# Patient Record
Sex: Female | Born: 1995 | Race: Black or African American | Hispanic: No | Marital: Single | Smoking: Current some day smoker
Health system: Southern US, Community
[De-identification: ages and names within clinical notes are randomized; demographics above are authoritative.]

## PROBLEM LIST (undated history)

## (undated) DIAGNOSIS — J45909 Unspecified asthma, uncomplicated: Secondary | ICD-10-CM

## (undated) DIAGNOSIS — N939 Abnormal uterine and vaginal bleeding, unspecified: Principal | ICD-10-CM

## (undated) DIAGNOSIS — A749 Chlamydial infection, unspecified: Secondary | ICD-10-CM

## (undated) DIAGNOSIS — N840 Polyp of corpus uteri: Secondary | ICD-10-CM

## (undated) DIAGNOSIS — G43909 Migraine, unspecified, not intractable, without status migrainosus: Secondary | ICD-10-CM

## (undated) HISTORY — DX: Abnormal uterine and vaginal bleeding, unspecified: N93.9

## (undated) HISTORY — PX: TONSILLECTOMY: SUR1361

---
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
## 2014-07-18 ENCOUNTER — Encounter (HOSPITAL_COMMUNITY): Payer: Self-pay | Admitting: *Deleted

## 2014-07-18 ENCOUNTER — Emergency Department (HOSPITAL_COMMUNITY)
Admission: EM | Admit: 2014-07-18 | Discharge: 2014-07-18 | Disposition: A | Discharge status: Home / Self Care | Payer: PRIVATE HEALTH INSURANCE | Attending: Emergency Medicine | Admitting: Emergency Medicine

## 2014-07-18 DIAGNOSIS — L239 Allergic contact dermatitis, unspecified cause: Secondary | ICD-10-CM | POA: Diagnosis not present

## 2014-07-18 DIAGNOSIS — Z79899 Other long term (current) drug therapy: Secondary | ICD-10-CM | POA: Insufficient documentation

## 2014-07-18 DIAGNOSIS — Z791 Long term (current) use of non-steroidal anti-inflammatories (NSAID): Secondary | ICD-10-CM | POA: Diagnosis not present

## 2014-07-18 DIAGNOSIS — R21 Rash and other nonspecific skin eruption: Secondary | ICD-10-CM | POA: Diagnosis present

## 2014-07-18 MED ORDER — DIPHENHYDRAMINE HCL 25 MG PO TABS: 25.0000 mg | ORAL_TABLET | Freq: Four times a day (QID) | ORAL | Status: DC

## 2014-07-18 NOTE — ED Provider Notes (Signed)
CSN: [id number]     Arrival date & time 07/18/14  0820 History   First MD Initiated Contact with Patient 07/18/14 [phone]     Chief Complaint  Patient presents with  . Rash     (Consider location/radiation/quality/duration/timing/severity/associated sxs/prior Treatment) HPI The patient presents stating she has scabies and she believes she has that because there are bedbugs at her residence. She does not have any known contacts that has been diagnosed with scabies. She states the bedbugs have been visualized and they are trying to treat her home. She reports that she's had itching and bumps on the back of her arms and some on her lower back. She reports this is, over the past few days. There is none present on her face neck hands and feet. There are no associated symptoms. History reviewed. No pertinent past medical history. History reviewed. No pertinent past surgical history. No family history on file. History  Substance Use Topics  . Smoking status: Never Smoker   . Smokeless tobacco: Not on file  . Alcohol Use: No   OB History    No data available     Review of Systems  Constitutional: No fevers no chills no general illness.  Allergies  Review of patient's allergies indicates no known allergies.  Home Medications   Prior to Admission medications   Medication Sig Start Date End Date Taking? Authorizing Provider  diphenhydrAMINE (BENADRYL) 25 MG tablet Take 1 tablet (25 mg total) by mouth every 6 (six) hours. 07/18/14   Arby Barrette, MD  methocarbamol (ROBAXIN) 500 MG tablet Take 1 tablet (500 mg total) by mouth 2 (two) times daily. 12/20/13   Heather Laisure, PA-C  naproxen (NAPROSYN) 500 MG tablet Take 1 tablet (500 mg total) by mouth 2 (two) times daily. 12/20/13   Heather Laisure, PA-C  traMADol (ULTRAM) 50 MG tablet Take 1 tablet (50 mg total) by mouth every 6 (six) hours as needed. 12/20/13   Heather Laisure, PA-C   BP 142/90 mmHg  Pulse 92  Temp(Src) 97.5 F (36.4 C)  (Oral)  Resp 17  SpO2 100% Physical Exam  Constitutional: She is oriented to person, place, and time. She appears well-developed and well-nourished. No distress.  HENT:  Head: Normocephalic and atraumatic.  Right Ear: External ear normal.  Left Ear: External ear normal.  Nose: Nose normal.  Mouth/Throat: Oropharynx is clear and moist.  The head scalp and neck are free of any lesions. Skin is all normal.  Eyes: EOM are normal. Pupils are equal, round, and reactive to light. Right eye exhibits no discharge. Left eye exhibits no discharge.  Neck: Neck supple.  Pulmonary/Chest: Effort normal.  Musculoskeletal: Normal range of motion.  Neurological: She is alert and oriented to person, place, and time. She exhibits normal muscle tone. Coordination normal.  Skin: Skin is warm and dry.  The patient has a fine papular rash concentrated on the dorsal and medial surfaces of her upper arm and over the elbow. This is on both arms. There are no linear burrows or plaques. Of note all of the web spaces are in normal condition. No rashes present on her hands or the flexor surfaces of her extremities. Lower legs are free of rash. They're about 3-5 very subtle papules with a dry scab on them less than 3 mm in size on her lower back.  Psychiatric: She has a normal mood and affect.    ED Course  Procedures (including critical care time) Labs Review Labs Reviewed - No data  to display  Imaging Review No results found.   EKG Interpretation None      MDM   Final diagnoses:  Allergic dermatitis   Patient voices concern for scabies. At this point there is no evidence of scabies, the rash is not typical and there is no known contact. Patient reports having known bedbugs at her home. Rash is possibly secondary to bedbugs but is atypical given the fine, papular nature that is concentrated on the dorsal aspects of the arms. Rash is most suggestive of a contact dermatitis or mild folliculitis. The patient  will be treated with Benadryl. At this point rash is not extensive enough to warrant steroid therapy. Discharge instructions provided education on bedbugs and scabies. The nature of these 2 diagnoses was explained to the patient.    Arby Barrette, MD 07/18/14 [phone]

## 2014-07-18 NOTE — Discharge Instructions (Signed)
Information about Bedbugs Bedbugs are tiny bugs that live in and around beds. During the day, they hide in mattresses and other places near beds. They come out at night and bite people lying in bed. They need blood to live and grow. Bedbugs can be found in beds anywhere. Usually, they are found in places where many people come and go (hotels, shelters, hospitals). It does not matter whether the place is dirty or clean. Getting bitten by bedbugs rarely causes a medical problem. The biggest problem can be getting rid of them. This often takes the work of a Oncologist. CAUSES  Less use of pesticides. Bedbugs were common before the 1950s. Then, strong pesticides such as DDT nearly wiped them out. Today, these pesticides are not used because they harm the environment and can cause health problems.  More travel. Besides mattresses, bedbugs can also live in clothing and luggage. They can come along as people travel from place to place. Bedbugs are more common in certain parts of the world. When people travel to those areas, the bugs can come home with them.  Presence of birds and bats. Bedbugs often infest birds and bats. If you have these animals in or near your home, bedbugs may infest your house, too. SYMPTOMS It does not hurt to be bitten by a bedbug. You will probably not wake up when you are bitten. Bedbugs usually bite areas of the skin that are not covered. Symptoms may show when you wake up, or they may take a day or more to show up. Symptoms may include:  Small red bumps on the skin. These might be lined up in a row or clustered in a group.  A darker red dot in the middle of red bumps.  Blisters on the skin. There may be swelling and very bad itching. These may be signs of an allergic reaction. This does not happen often. DIAGNOSIS Bedbug bites might look and feel like other types of insect bites. The bugs do not stay on the body like ticks or lice. They bite, drop off, and crawl away  to hide. Your caregiver will probably:  Ask about your symptoms.  Ask about your recent activities and travel.  Check your skin for bedbug bites.  Ask you to check at home for signs of bedbugs. You should look for:  Spots or stains on the bed or nearby. This could be from bedbugs that were crushed or from their eggs or waste.  Bedbugs themselves. They are reddish-brown, oval, and flat. They do not fly. They are about the size of an apple seed.  Places to look for bedbugs include:  Beds. Check mattresses, headboards, box springs, and bed frames.  On drapes and curtains near the bed.  Under carpeting in the bedroom.  Behind electrical outlets.  Behind any wallpaper that is peeling.  Inside luggage. TREATMENT Most bedbug bites do not need treatment. They usually go away on their own in a few days. The bites are not dangerous. However, treatment may be needed if you have scratched so much that your skin has become infected. You may also need treatment if you are allergic to bedbug bites. Treatment options include:  A drug that stops swelling and itching (corticosteroid). Usually, a cream is rubbed on the skin. If you have a bad rash, you may be given a corticosteroid pill.  Oral antihistamines. These are pills to help control itching.  Antibiotic medicines. An antibiotic may be prescribed for infected skin. HOME CARE  INSTRUCTIONS   Take any medicine prescribed by your caregiver for your bites. Follow the directions carefully.  Consider wearing pajamas with long sleeves and pant legs.  Your bedroom may need to be treated. A pest control expert should make sure the bedbugs are gone. You may need to throw away mattresses or luggage. Ask the pest control expert what you can do to keep the bedbugs from coming back. Common suggestions include:  Putting a plastic cover over your mattress.  Washing and drying your clothes and bedding in hot water and a hot dryer. The temperature  should be hotter than 120 F (48.9 C). Bedbugs are killed by high temperatures.  Vacuuming carefully all around your bed. Vacuum in all cracks and crevices where the bugs might hide. Do this often.  Carefully checking all used furniture, bedding, or clothes that you bring into your house.  Eliminating bird nests and bat roosts.  If you get bedbug bites when traveling, check all your possessions carefully before bringing them into your house. If you find any bugs on clothes or in your luggage, consider throwing those items away. SEEK MEDICAL CARE IF:  You have red bug bites that keep coming back.  You have red bug bites that itch badly.  You have bug bites that cause a skin rash.  You have scratch marks that are red and sore. SEEK IMMEDIATE MEDICAL CARE IF: You have a fever. Document Released: 02/19/2010 Document Revised: 04/11/2011 Document Reviewed: 02/19/2010 [facility] Patient Information 2015 [place], [place]. This information is not intended to replace advice given to you by your health care provider. Make sure you discuss any questions you have with your health care provider. Information about Scabies Scabies are small bugs (mites) that burrow under the skin and cause red bumps and severe itching. These bugs can only be seen with a microscope. Scabies are highly contagious. They can spread easily from person to person by direct contact. They are also spread through sharing clothing or linens that have the scabies mites living in them. It is not unusual for an entire family to become infected through shared towels, clothing, or bedding.  HOME CARE INSTRUCTIONS   Your caregiver may prescribe a cream or lotion to kill the mites. If cream is prescribed, massage the cream into the entire body from the neck to the bottom of both feet. Also massage the cream into the scalp and face if your child is less than 20 year old. Avoid the eyes and mouth. Do not wash your hands after  application.  Leave the cream on for 8 to 12 hours. Your child should bathe or shower after the 8 to 12 hour application period. Sometimes it is helpful to apply the cream to your child right before bedtime.  One treatment is usually effective and will eliminate approximately 95% of infestations. For severe cases, your caregiver may decide to repeat the treatment in 1 week. Everyone in your household should be treated with one application of the cream.  New rashes or burrows should not appear within 24 to 48 hours after successful treatment. However, the itching and rash may last for 2 to 4 weeks after successful treatment. Your caregiver may prescribe a medicine to help with the itching or to help the rash go away more quickly.  Scabies can live on clothing or linens for up to 3 days. All of your child's recently used clothing, towels, stuffed toys, and bed linens should be washed in hot water and then dried in  a dryer for at least 20 minutes on high heat. Items that cannot be washed should be enclosed in a plastic bag for at least 3 days.  To help relieve itching, bathe your child in a cool bath or apply cool washcloths to the affected areas.  Your child may return to school after treatment with the prescribed cream. SEEK MEDICAL CARE IF:   The itching persists longer than 4 weeks after treatment.  The rash spreads or becomes infected. Signs of infection include red blisters or yellow-tan crust. Document Released: 01/17/2005 Document Revised: 04/11/2011 Document Reviewed: 05/28/2008 [facility] Patient Information 2015 [place], [place]. This information is not intended to replace advice given to you by your health care provider. Make sure you discuss any questions you have with your health care provider.

## 2014-07-18 NOTE — ED Notes (Signed)
Pt reports infestation of bedbugs at her place of residence, sts has scabies on her arms and legs. Pt sts her skin is "getting raw it's that bad". Rash noted on bilateral arms.

## 2014-09-11 ENCOUNTER — Emergency Department (INDEPENDENT_AMBULATORY_CARE_PROVIDER_SITE_OTHER)
Admission: EM | Admit: 2014-09-11 | Discharge: 2014-09-11 | Disposition: A | Discharge status: Home / Self Care | Payer: PRIVATE HEALTH INSURANCE | Source: Home / Self Care

## 2014-09-11 ENCOUNTER — Encounter (HOSPITAL_COMMUNITY): Payer: Self-pay

## 2014-09-11 DIAGNOSIS — M2142 Flat foot [pes planus] (acquired), left foot: Secondary | ICD-10-CM

## 2014-09-11 DIAGNOSIS — M2141 Flat foot [pes planus] (acquired), right foot: Secondary | ICD-10-CM

## 2014-09-11 DIAGNOSIS — M259 Joint disorder, unspecified: Secondary | ICD-10-CM | POA: Diagnosis not present

## 2014-09-11 DIAGNOSIS — R29898 Other symptoms and signs involving the musculoskeletal system: Secondary | ICD-10-CM

## 2014-09-11 MED ORDER — DICLOFENAC SODIUM 75 MG PO TBEC: 75.0000 mg | DELAYED_RELEASE_TABLET | Freq: Two times a day (BID) | ORAL | Status: DC

## 2014-09-11 NOTE — Discharge Instructions (Signed)
Because of your foot pain is likely from flat feet and weak ankles. Please remember to wear shoes that provide good arch support, performed ankle exercises as discussed, use the Voltaren for more prolonged pain and inflammation relief, or use ibuprofen 600 mg every 6 hours, and a compression sleeve to help with support. This pain may come and go from time to time over the course of her life but would likely benefit from the measures mentioned above. May eventually need to see a podiatrist for formal physical therapy.

## 2014-09-11 NOTE — ED Notes (Signed)
Patient here for foot and ankle swelling to her left foot  Foot has been this way for about a month Denies any injury

## 2014-09-11 NOTE — ED Provider Notes (Signed)
CSN: [id number]     Arrival date & time 09/11/14  1938 History   None    Chief Complaint  Patient presents with  . Foot Pain   (Consider location/radiation/quality/duration/timing/severity/associated sxs/prior Treatment) HPI  L ankle and foot pain: ongoing for 3-4 weeks. Started along the outside and top of foot. Progressed to the entire foot adn ankle. No injury to the ankle and foot. Sharp pain. Intermittent. Pain is daily but not always when walking. Nothing makes it worse. Pain episodes last around to 1 hour. Not currently in pain.    History reviewed. No pertinent past medical history. History reviewed. No pertinent past surgical history. Family History  Problem Relation Age of Onset  . Diabetes Other   . Asthma Other   . Cancer Other    Social History  Substance Use Topics  . Smoking status: Never Smoker   . Smokeless tobacco: None  . Alcohol Use: No   OB History    No data available     Review of Systems Per HPI with all other pertinent systems negative.   Allergies  Review of patient's allergies indicates no known allergies.  Home Medications   Prior to Admission medications   Medication Sig Start Date End Date Taking? Authorizing Provider  diclofenac (VOLTAREN) 75 MG EC tablet Take 1 tablet (75 mg total) by mouth 2 (two) times daily. 09/11/14   Ozella Rocks, MD   BP 102/88 mmHg  Pulse 91  Temp(Src) 98.2 F (36.8 C) (Oral)  Resp 16  SpO2 100% Physical Exam Physical Exam  Constitutional: oriented to person, place, and time. appears well-developed and well-nourished. No distress.  HENT:  Head: Normocephalic and atraumatic.  Eyes: EOMI. PERRL.  Neck: Normal range of motion.  Cardiovascular: RRR, no m/r/g, 2+ distal pulses,  Pulmonary/Chest: Effort normal and breath sounds normal. No respiratory distress.  Abdominal: Soft. Bowel sounds are normal. NonTTP, no distension.  Musculoskeletal: Pes planus bilaterally with left greater than right, no  ankle instability. Full range of motion. No edema or effusion noted. No bony abnormality. Diffuse intermittent tenderness of the ankle without focal tenderness. Patient unable to do single leg stand.  Neurological: alert and oriented to person, place, and time.  Skin: Skin is warm. No rash noted. non diaphoretic.  Psychiatric: normal mood and affect. behavior is normal. Judgment and thought content normal.   ED Course  Procedures (including critical care time) Labs Review Labs Reviewed - No data to display  Imaging Review No results found.   MDM   1. Pes planus of both feet   2. Ankle weakness    The majority of patient's pain in her ankle and foot is likely from poor biomechanics including pes planus and weak ankles. Detailed exercise instructions given to patient regarding ankle strengthening, use of neoprene compression sleeve, she will inserts for arch support, and anti-inflammatories. Patient follow-up with podiatry, sports medicine physical therapy if needed for additional care. Patient may benefit greatly from custom orthotics.    Ozella Rocks, MD 09/11/14 2036

## 2014-10-18 ENCOUNTER — Emergency Department (HOSPITAL_COMMUNITY)
Admission: EM | Admit: 2014-10-18 | Discharge: 2014-10-18 | Disposition: A | Discharge status: Home / Self Care | Payer: No Typology Code available for payment source | Attending: Emergency Medicine | Admitting: Emergency Medicine

## 2014-10-18 ENCOUNTER — Encounter (HOSPITAL_COMMUNITY): Payer: Self-pay | Admitting: Emergency Medicine

## 2014-10-18 DIAGNOSIS — Z8679 Personal history of other diseases of the circulatory system: Secondary | ICD-10-CM | POA: Diagnosis not present

## 2014-10-18 DIAGNOSIS — Z793 Long term (current) use of hormonal contraceptives: Secondary | ICD-10-CM | POA: Insufficient documentation

## 2014-10-18 DIAGNOSIS — R51 Headache: Secondary | ICD-10-CM | POA: Diagnosis present

## 2014-10-18 DIAGNOSIS — R519 Headache, unspecified: Secondary | ICD-10-CM

## 2014-10-18 HISTORY — DX: Migraine, unspecified, not intractable, without status migrainosus: G43.909

## 2014-10-18 MED ORDER — METOCLOPRAMIDE HCL 10 MG PO TABS
5.0000 mg | ORAL_TABLET | Freq: Once | ORAL | Status: AC
  Administered 2014-10-18: 5 mg via ORAL
  Filled 2014-10-18: qty 1

## 2014-10-18 MED ORDER — DIPHENHYDRAMINE HCL 25 MG PO CAPS
25.0000 mg | ORAL_CAPSULE | Freq: Once | ORAL | Status: AC
  Administered 2014-10-18: 25 mg via ORAL
  Filled 2014-10-18: qty 1

## 2014-10-18 MED ORDER — KETOROLAC TROMETHAMINE 30 MG/ML IJ SOLN
30.0000 mg | Freq: Once | INTRAMUSCULAR | Status: AC
  Administered 2014-10-18: 30 mg via INTRAVENOUS
  Filled 2014-10-18: qty 1

## 2014-10-18 NOTE — ED Provider Notes (Signed)
CSN: [id number]     Arrival date & time 10/18/14  1108 History   First MD Initiated Contact with Patient 10/18/14 1129     Chief Complaint  Patient presents with  . Headache   HPI    19 year old female presents with a headache. Patient reports significant past medical history of headaches, reports this is similar to previous presentations. Patient reports this one started slowly yesterday, reports that it is "everywhere", not relieved with that home over-the-counter therapies including ibuprofen or Tylenol. She reports photophobia, phonophobia yesterday that has since resolved. Patient denies trauma to the head, fever, changes in vision smelt taste, any focal neurological deficits, neck stiffness, insect bites, rash, or any other red flags for headache. Patient reports that she seen her primary care provider for her headaches, has been prescribed tramadol with minimal relief of symptoms. Patient reports she's never seen a specialist for her headaches. Patient denies drug or alcohol use, reports that she drinks plenty of water, but notes that she's been outside significantly with prolonged exposure to heat.  Past Medical History  Diagnosis Date  . Migraine    Past Surgical History  Procedure Laterality Date  . Tonsillectomy     Family History  Problem Relation Age of Onset  . Diabetes Other   . Asthma Other   . Cancer Other    Social History  Substance Use Topics  . Smoking status: Never Smoker   . Smokeless tobacco: None  . Alcohol Use: No   OB History    No data available     Review of Systems  All other systems reviewed and are negative.   Allergies  Review of patient's allergies indicates no known allergies.  Home Medications   Prior to Admission medications   Medication Sig Start Date End Date Taking? Authorizing Provider  norethindrone-ethinyl estradiol (MICROGESTIN FE 1/20) 1-20 MG-MCG tablet Take 1 tablet by mouth daily.   Yes Historical Provider, MD  diclofenac  (VOLTAREN) 75 MG EC tablet Take 1 tablet (75 mg total) by mouth 2 (two) times daily. Patient not taking: Reported on 10/18/2014 09/11/14   Ozella Rocks, MD   BP 136/82 mmHg  Pulse 72  Temp(Src) 98 F (36.7 C) (Oral)  Resp 16  Ht  (1.753 m)  Wt 257 lb (116.574 kg)  BMI 37.93 kg/m2  SpO2 100%  LMP 10/02/2014   Physical Exam  Constitutional: She is oriented to person, place, and time. She appears well-developed and well-nourished.  HENT:  Head: Normocephalic and atraumatic.  Eyes: Conjunctivae and EOM are normal. Pupils are equal, round, and reactive to light. Right eye exhibits no discharge. Left eye exhibits no discharge. No scleral icterus. Right eye exhibits normal extraocular motion and no nystagmus. Left eye exhibits normal extraocular motion and no nystagmus.  Neck: Normal range of motion. No JVD present. No tracheal deviation present.  Cardiovascular: Normal rate, regular rhythm, normal heart sounds and intact distal pulses.  Exam reveals no gallop and no friction rub.   No murmur heard. Pulmonary/Chest: Effort normal. No stridor.  Neurological: She is alert and oriented to person, place, and time. She has normal strength. No cranial nerve deficit or sensory deficit. Coordination normal. GCS eye subscore is 4. GCS verbal subscore is 5. GCS motor subscore is 6.  Psychiatric: She has a normal mood and affect. Her behavior is normal. Judgment and thought content normal.  Nursing note and vitals reviewed.   ED Course  Procedures (including critical care time) Labs Review Labs  Reviewed - No data to display  Imaging Review No results found. I have personally reviewed and evaluated these images and lab results as part of my medical decision-making.   EKG Interpretation None      MDM   Final diagnoses:  Acute nonintractable headache, unspecified headache type    Labs:  Imaging:  Consults:  Therapeutics: Benadryl, Reglan, Toradol  Discharge Meds:    Assessment/Plan: Patient presents with headache, no red flags, similar to previous. Patient treated here in the ED with above therapeutics with complete resolution of symptoms. Patient instructed to monitor for new or worsening signs or symptoms return immediately if any present. Patient encouraged follow-up with her primary care provider for reevaluation. Patient given follow-up information for neurology.         Eyvonne Mechanic, PA-C 10/18/14 2012  Glynn Octave, MD 10/19/14 [phone]

## 2014-10-18 NOTE — ED Notes (Signed)
Pt ambulated to BR with no difficulty.  

## 2014-10-18 NOTE — ED Notes (Signed)
Pt reports history of migraines. Last night while at work, pt reports had a migraine that continues into today. Pt reports nausea. Denies vision changes.

## 2014-11-06 ENCOUNTER — Encounter (HOSPITAL_COMMUNITY): Payer: Self-pay | Admitting: Emergency Medicine

## 2014-11-06 ENCOUNTER — Emergency Department (HOSPITAL_COMMUNITY)
Admission: EM | Admit: 2014-11-06 | Discharge: 2014-11-07 | Disposition: A | Discharge status: Home / Self Care | Payer: PRIVATE HEALTH INSURANCE | Attending: Emergency Medicine | Admitting: Emergency Medicine

## 2014-11-06 DIAGNOSIS — Z79818 Long term (current) use of other agents affecting estrogen receptors and estrogen levels: Secondary | ICD-10-CM | POA: Insufficient documentation

## 2014-11-06 DIAGNOSIS — G43809 Other migraine, not intractable, without status migrainosus: Secondary | ICD-10-CM

## 2014-11-06 DIAGNOSIS — R51 Headache: Secondary | ICD-10-CM | POA: Diagnosis present

## 2014-11-06 MED ORDER — SODIUM CHLORIDE 0.9 % IV SOLN
1000.0000 mL | INTRAVENOUS | Status: DC
  Administered 2014-11-07: 1000 mL via INTRAVENOUS

## 2014-11-06 MED ORDER — PROCHLORPERAZINE EDISYLATE 5 MG/ML IJ SOLN
10.0000 mg | Freq: Once | INTRAMUSCULAR | Status: AC
  Administered 2014-11-07: 10 mg via INTRAVENOUS
  Filled 2014-11-06: qty 2

## 2014-11-06 MED ORDER — DEXAMETHASONE SODIUM PHOSPHATE 10 MG/ML IJ SOLN
10.0000 mg | Freq: Once | INTRAMUSCULAR | Status: AC
  Administered 2014-11-07: 10 mg via INTRAVENOUS
  Filled 2014-11-06: qty 1

## 2014-11-06 MED ORDER — DIPHENHYDRAMINE HCL 50 MG/ML IJ SOLN
25.0000 mg | Freq: Once | INTRAMUSCULAR | Status: AC
  Administered 2014-11-07: 25 mg via INTRAVENOUS
  Filled 2014-11-06: qty 1

## 2014-11-06 NOTE — ED Provider Notes (Signed)
CSN: [id number]     Arrival date & time 11/06/14  2218 History   First MD Initiated Contact with Patient 11/06/14 2311     Chief Complaint  Patient presents with  . Migraine     HPI  patient presents to the emergency room with a recurrent headache. The headache is diffuse and throbbing. Associated with her headache she has nausea without vomiting. lites seem to exacerbate the headache. She has a history of recurrent headaches since age 19. Patient has been diagnosed with migraines. She does not have a primary doctor and has never seen a neurologist. She gets these headaches at least twice a week. No fevers or chills. No recent injuries. No neck pain or stiffness. No numbness or weakness. Past Medical History  Diagnosis Date  . Migraine    Past Surgical History  Procedure Laterality Date  . Tonsillectomy     Family History  Problem Relation Age of Onset  . Diabetes Other   . Asthma Other   . Cancer Other    Social History  Substance Use Topics  . Smoking status: Never Smoker   . Smokeless tobacco: None  . Alcohol Use: No   OB History    No data available     Review of Systems  All other systems reviewed and are negative.     Allergies  Review of patient's allergies indicates no known allergies.  Home Medications   Prior to Admission medications   Medication Sig Start Date End Date Taking? Authorizing Provider  norethindrone-ethinyl estradiol (MICROGESTIN FE 1/20) 1-20 MG-MCG tablet Take 1 tablet by mouth daily.   Yes Historical Provider, MD  diclofenac (VOLTAREN) 75 MG EC tablet Take 1 tablet (75 mg total) by mouth 2 (two) times daily. Patient not taking: Reported on 10/18/2014 09/11/14   Ozella Rocks, MD  isometheptene-acetaminophen-dichloralphenazone (MIDRIN) [phone] MG capsule Take 1 capsule by mouth 4 (four) times daily as needed for migraine. Maximum 5 capsules in 12 hours for migraine headaches, 8 capsules in 24 hours for tension headaches. 11/07/14   Linwood Dibbles, MD   BP 146/82 mmHg  Pulse 85  Resp 18  SpO2 100%  LMP 11/03/2014 (Exact Date) Physical Exam  Constitutional: She appears well-developed and well-nourished. No distress.  HENT:  Head: Normocephalic and atraumatic.  Right Ear: External ear normal.  Left Ear: External ear normal.  Eyes: Conjunctivae are normal. Right eye exhibits no discharge. Left eye exhibits no discharge. No scleral icterus.  Neck: Neck supple. No tracheal deviation present.  Cardiovascular: Normal rate, regular rhythm and intact distal pulses.   Pulmonary/Chest: Effort normal and breath sounds normal. No stridor. No respiratory distress. She has no wheezes. She has no rales.  Abdominal: Soft. Bowel sounds are normal. She exhibits no distension. There is no tenderness. There is no rebound and no guarding.  Musculoskeletal: She exhibits no edema or tenderness.  Neurological: She is alert. She has normal strength. No cranial nerve deficit (no facial droop, extraocular movements intact, no slurred speech) or sensory deficit. She exhibits normal muscle tone. She displays no seizure activity. Coordination normal.  Skin: Skin is warm and dry. No rash noted.  Psychiatric: She has a normal mood and affect.  Nursing note and vitals reviewed.   ED Course  Procedures (including critical care time) Medications  0.9 %  sodium chloride infusion (1,000 mLs Intravenous New Bag/Given 11/07/14 0010)  prochlorperazine (COMPAZINE) injection 10 mg (10 mg Intravenous Given 11/07/14 0010)  diphenhydrAMINE (BENADRYL) injection 25 mg (25  mg Intravenous Given 11/07/14 0010)  dexamethasone (DECADRON) injection 10 mg (10 mg Intravenous Given 11/07/14 0010)     MDM   Final diagnoses:  Other migraine without status migrainosus, not intractable    Sx improved with treatment.  Will refer to neurology considering her recurrent migraine headaches.    At this time there does not appear to be any evidence of an acute emergency medical  condition and the patient appears stable for discharge with appropriate outpatient follow up.   Linwood Dibbles, MD 11/07/14 [phone]

## 2014-11-06 NOTE — ED Notes (Signed)
Pt states she has a migraine headache that started around 4pm today   Pt was seen last week for same and mother states what they gave her did not help  Pt has nausea without vomiting  Pt is sensitive to light

## 2014-11-07 MED ORDER — ISOMETHEPTENE-DICHLORAL-APAP 65-100-325 MG PO CAPS: 1.0000 | ORAL_CAPSULE | Freq: Four times a day (QID) | ORAL | Status: DC | PRN

## 2014-11-07 NOTE — Discharge Instructions (Signed)

## 2014-12-03 ENCOUNTER — Encounter: Payer: Self-pay | Admitting: *Deleted

## 2014-12-03 ENCOUNTER — Telehealth: Payer: Self-pay | Admitting: Neurology

## 2014-12-03 NOTE — Telephone Encounter (Signed)
[name] write a note thanks

## 2014-12-03 NOTE — Telephone Encounter (Signed)
Called pt and said I will write letter and put it up front for her to pick up. She needs to bring a valid photo ID with her. She verbalized understanding and said she has the address to our office.

## 2014-12-03 NOTE — Telephone Encounter (Signed)
Patient called to request a note for her job regarding upcoming New Patient Appointment Monday 12/08/14. Patient states that her office is closed on Monday and will be closed for the remainder of the week. Patient needs note today.

## 2014-12-08 ENCOUNTER — Telehealth: Payer: Self-pay | Admitting: *Deleted

## 2014-12-08 ENCOUNTER — Ambulatory Visit: Payer: Medicaid Other | Admitting: Neurology

## 2014-12-08 NOTE — Telephone Encounter (Signed)
no showed new patient appointment. 

## 2014-12-09 ENCOUNTER — Encounter: Payer: Self-pay | Admitting: Neurology

## 2014-12-31 ENCOUNTER — Encounter (HOSPITAL_COMMUNITY): Payer: Self-pay

## 2014-12-31 ENCOUNTER — Emergency Department (HOSPITAL_COMMUNITY)
Admission: EM | Admit: 2014-12-31 | Discharge: 2014-12-31 | Disposition: A | Discharge status: Home / Self Care | Payer: No Typology Code available for payment source | Attending: Emergency Medicine | Admitting: Emergency Medicine

## 2014-12-31 DIAGNOSIS — R0982 Postnasal drip: Secondary | ICD-10-CM

## 2014-12-31 DIAGNOSIS — J3089 Other allergic rhinitis: Secondary | ICD-10-CM

## 2014-12-31 DIAGNOSIS — L299 Pruritus, unspecified: Secondary | ICD-10-CM | POA: Insufficient documentation

## 2014-12-31 DIAGNOSIS — J029 Acute pharyngitis, unspecified: Secondary | ICD-10-CM

## 2014-12-31 DIAGNOSIS — J302 Other seasonal allergic rhinitis: Secondary | ICD-10-CM | POA: Insufficient documentation

## 2014-12-31 DIAGNOSIS — H6993 Unspecified Eustachian tube disorder, bilateral: Secondary | ICD-10-CM | POA: Insufficient documentation

## 2014-12-31 DIAGNOSIS — H6983 Other specified disorders of Eustachian tube, bilateral: Secondary | ICD-10-CM

## 2014-12-31 DIAGNOSIS — G43909 Migraine, unspecified, not intractable, without status migrainosus: Secondary | ICD-10-CM | POA: Insufficient documentation

## 2014-12-31 DIAGNOSIS — R0981 Nasal congestion: Secondary | ICD-10-CM

## 2014-12-31 DIAGNOSIS — H938X3 Other specified disorders of ear, bilateral: Secondary | ICD-10-CM

## 2014-12-31 DIAGNOSIS — Z79818 Long term (current) use of other agents affecting estrogen receptors and estrogen levels: Secondary | ICD-10-CM | POA: Insufficient documentation

## 2014-12-31 MED ORDER — LORATADINE 10 MG PO TABS: 10.0000 mg | ORAL_TABLET | Freq: Every day | ORAL | Status: DC

## 2014-12-31 MED ORDER — FLUTICASONE PROPIONATE 50 MCG/ACT NA SUSP: 2.0000 | Freq: Every day | NASAL | Status: DC

## 2014-12-31 NOTE — ED Provider Notes (Signed)
CSN: [id number]     Arrival date & time 12/31/14  1145 History  By signing my name below, I, Placido SouLogan Joldersma, attest that this documentation has been prepared under the direction and in the presence of Thelton Graca Camprubi-Soms, PA-C. Electronically Signed: Placido SouLogan Joldersma, ED Scribe. 12/31/2014. 12:23 PM.   Chief Complaint  Patient presents with  . Otalgia   Patient is a 19 y.o. female presenting with ear pain. The history is provided by the patient. No language interpreter was used.  Otalgia Location:  Bilateral Behind ear:  No abnormality Quality:  Pressure Severity:  Mild Onset quality:  Gradual Duration:  2 days Timing:  Constant Progression:  Unchanged Chronicity:  New Context: no water in ear   Relieved by:  Nothing Worsened by:  Nothing tried Ineffective treatments:  None tried Associated symptoms: congestion and sore throat   Associated symptoms: no abdominal pain, no diarrhea, no ear discharge, no fever, no rash, no rhinorrhea and no vomiting   Risk factors: no recent travel    HPI Comments: Lowanda Fosterlizae D Dahmen is a 19 y.o. female who presents to the Emergency Department complaining of constant, mild, bilateral ear pain with onset 2 days ago. Pt states the pain is 6/10, constant, pressure-like ear pain which is non-radiating, with no known aggravating or alleviating factors. Associated symptoms include sore throat, sinus congestion, generalized itching without rash, and chills. Pt confirms a sick contact at work with similar symptoms and denies any recent swimming or travel. She denies a hx of seasonal allergies, but thinks she's allergic to dogs and was recently in contact with a dog. She denies drooling, trismus, rhinorrhea, ear drainage, eye itching/redness, fever, SOB, CP, cough, abd pain, n/v/d, constipation, dysuria, hematuria, numbness, tingling, weakness, rashes, and myalgias.   PCP: None  Past Medical History  Diagnosis Date  . Migraine    Past Surgical History   Procedure Laterality Date  . Tonsillectomy     Family History  Problem Relation Age of Onset  . Diabetes Other   . Asthma Other   . Cancer Other    Social History  Substance Use Topics  . Smoking status: Never Smoker   . Smokeless tobacco: None  . Alcohol Use: No   OB History    No data available     Review of Systems  Constitutional: Positive for chills. Negative for fever.  HENT: Positive for congestion, ear pain, sinus pressure and sore throat. Negative for drooling, ear discharge, rhinorrhea and trouble swallowing.   Eyes: Negative for redness.  Respiratory: Negative for shortness of breath.   Cardiovascular: Negative for chest pain.  Gastrointestinal: Negative for nausea, vomiting, abdominal pain, diarrhea and constipation.  Genitourinary: Negative for dysuria and hematuria.  Musculoskeletal: Negative for myalgias and arthralgias.  Skin: Negative for rash.  Allergic/Immunologic: Negative for environmental allergies and immunocompromised state.  Neurological: Negative for weakness and numbness.  A complete 10 system review of systems was obtained and all systems are negative except as noted in the HPI and PMH.    Allergies  Review of patient's allergies indicates no known allergies.  Home Medications   Prior to Admission medications   Medication Sig Start Date End Date Taking? Authorizing Provider  diclofenac (VOLTAREN) 75 MG EC tablet Take 1 tablet (75 mg total) by mouth 2 (two) times daily. Patient not taking: Reported on 10/18/2014 09/11/14   Ozella Rocksavid J Merrell, MD  isometheptene-acetaminophen-dichloralphenazone (MIDRIN) [phone] MG capsule Take 1 capsule by mouth 4 (four) times daily as needed for migraine. Maximum  5 capsules in 12 hours for migraine headaches, 8 capsules in 24 hours for tension headaches. 11/07/14   Linwood Dibbles, MD  norethindrone-ethinyl estradiol (MICROGESTIN FE 1/20) 1-20 MG-MCG tablet Take 1 tablet by mouth daily.    Historical Provider, MD   BP  126/69 mmHg  Pulse 80  Temp(Src) 98.1 F (36.7 C) (Oral)  Resp 15  Ht  (1.753 m)  Wt 257 lb (116.574 kg)  BMI 37.93 kg/m2  SpO2 100%  LMP 12/22/2014 (Exact Date) Physical Exam  Constitutional: She is oriented to person, place, and time. Vital signs are normal. She appears well-developed and well-nourished.  Non-toxic appearance. No distress.  Afebrile, nontoxic, NAD  HENT:  Head: Normocephalic and atraumatic.  Right Ear: Hearing, tympanic membrane, external ear and ear canal normal. Tympanic membrane is not injected, not erythematous and not bulging. No middle ear effusion.  Left Ear: Hearing, tympanic membrane, external ear and ear canal normal. Tympanic membrane is not injected, not erythematous and not bulging.  No middle ear effusion.  Nose: Mucosal edema present. No rhinorrhea.  Mouth/Throat: Uvula is midline, oropharynx is clear and moist and mucous membranes are normal. No trismus in the jaw. No uvula swelling.  Ears are clear bilaterally, no TM bulging or erythema, no effusion. Nose with mild nasal mucosa edema and cobblestoning without rhinorrhea. Oropharynx clear and moist, without uvular swelling or deviation, no trismus or drooling, no tonsillar swelling or erythema, no exudates.   Eyes: Conjunctivae and EOM are normal. Right eye exhibits no discharge. Left eye exhibits no discharge.  Neck: Normal range of motion. Neck supple.  Cardiovascular: Normal rate.   Pulmonary/Chest: Effort normal. No respiratory distress.  Abdominal: Normal appearance. She exhibits no distension.  Musculoskeletal: Normal range of motion.  Lymphadenopathy:       Head (right side): No submandibular and no tonsillar adenopathy present.       Head (left side): No submandibular and no tonsillar adenopathy present.    She has no cervical adenopathy.  No head or neck LAD  Neurological: She is alert and oriented to person, place, and time. She has normal strength. No sensory deficit.  Skin: Skin is  warm, dry and intact. No rash noted.  Psychiatric: She has a normal mood and affect. Her behavior is normal.  Nursing note and vitals reviewed.  ED Course  Procedures  DIAGNOSTIC STUDIES: Oxygen Saturation is 100% on RA, normal by my interpretation.    COORDINATION OF CARE: 12:21 PM Discussed next steps with pt and she agreed to plan.   Labs Review Labs Reviewed - No data to display  Imaging Review No results found.   EKG Interpretation None      MDM   Final diagnoses:  Eustachian tube dysfunction, bilateral  Nasal congestion  Environmental and seasonal allergies  Itching  Ear pressure, bilateral  Sore throat  Postnasal drip    19 y.o. female here with bilateral ear pain and postnasal drip with some sinus congestion as well as generalized itching without rash 2 days. Nasal mucosa edematous with cobblestoning, suspicious for allergies. Throat clear, ears clear bilaterally. Likely eustachian tube dysfunction related to her nasal congestion, will treat with Flonase and started on Claritin. Discussed that this could also be a viral URI given that she has had positive sick contacts recently. Symptomatic control discussed. Follow-up with [facility] and wellness in 1 week for follow-up and to establish medical care. I explained the diagnosis and have given explicit precautions to return to the ER including  for any other new or worsening symptoms. The patient understands and accepts the medical plan as it's been dictated and I have answered their questions. Discharge instructions concerning home care and prescriptions have been given. The patient is STABLE and is discharged to home in good condition.   I personally performed the services described in this documentation, which was scribed in my presence. The recorded information has been reviewed and is accurate.  BP 126/69 mmHg  Pulse 80  Temp(Src) 98.1 F (36.7 C) (Oral)  Resp 15  Ht  (1.753 m)  Wt 116.574 kg  BMI 37.93  kg/m2  SpO2 100%  LMP 12/22/2014 (Exact Date)  Meds ordered this encounter  Medications  . fluticasone (FLONASE) 50 MCG/ACT nasal spray    Sig: Place 2 sprays into both nostrils daily.    Dispense:  16 g    Refill:  0    Order Specific Question:  Supervising Provider    Answer:  MILLER, BRIAN [[id number]]  . loratadine (CLARITIN) 10 MG tablet    Sig: Take 1 tablet (10 mg total) by mouth daily.    Dispense:  30 tablet    Refill:  0    Order Specific Question:  Supervising Provider    Answer:  Eber Hong [[id number]]      Osiris Charles Camprubi-Soms, PA-C 12/31/14 1234  Tilden Fossa, MD 12/31/14 [phone]

## 2014-12-31 NOTE — Discharge Instructions (Signed)
Continue to stay well-hydrated. Gargle warm salt water and spit it out. Continue to alternate between Tylenol and Ibuprofen for pain or fever. Use netipot and flonase to help with nasal congestion. May consider over-the-counter Benadryl or other antihistamine (claritin) to decrease secretions and for itching. Followup with [facility] in 5-7 days for recheck of ongoing symptoms and to establish medical care. Return to emergency department for emergent changing or worsening of symptoms.    Earache An earache, also called otalgia, can be caused by many things. Pain from an earache can be sharp, dull, or burning. The pain may be temporary or constant. Earaches can be caused by problems with the ear, such as infection in either the middle ear or the ear canal, injury, impacted ear wax, middle ear pressure, or a foreign body in the ear. Ear pain can also result from problems in other areas. This is called referred pain. For example, pain can come from a sore throat, a tooth infection, or problems with the jaw or the joint between the jaw and the skull (temporomandibular joint, or TMJ). The cause of an earache is not always easy to identify. Watchful waiting may be appropriate for some earaches until a clear cause of the pain can be found. HOME CARE INSTRUCTIONS Watch your condition for any changes. The following actions may help to lessen any discomfort that you are feeling:  Take medicines only as directed by your health care provider. This includes ear drops.  Apply ice to your outer ear to help reduce pain.  Put ice in a plastic bag.  Place a towel between your skin and the bag.  Leave the ice on for 20 minutes, 2-3 times per day.  Do not put anything in your ear other than medicine that is prescribed by your health care provider.  Try resting in an upright position instead of lying down. This may help to reduce pressure in the middle ear and relieve pain.  Chew gum if it helps to  relieve your ear pain.  Control any allergies that you have.  Keep all follow-up visits as directed by your health care provider. This is important. SEEK MEDICAL CARE IF:  Your pain does not improve within 2 days.  You have a fever.  You have new or worsening symptoms. SEEK IMMEDIATE MEDICAL CARE IF:  You have a severe headache.  You have a stiff neck.  You have difficulty swallowing.  You have redness or swelling behind your ear.  You have drainage from your ear.  You have hearing loss.  You feel dizzy.   This information is not intended to replace advice given to you by your health care provider. Make sure you discuss any questions you have with your health care provider.   Document Released: 09/04/2003 Document Revised: 02/07/2014 Document Reviewed: 08/18/2013 Elsevier Interactive Patient Education [employer] Allergies An allergy is when your body reacts to a substance in a way that is not normal. An allergic reaction can happen after you:  Eat something.  Breathe in something.  Touch something. WHAT KINDS OF ALLERGIES ARE THERE? You can be allergic to:  Things that are only around during certain seasons, like molds and pollens.  Foods.  Drugs.  Insects.  Animal dander. WHAT ARE SYMPTOMS OF ALLERGIES?  Puffiness (swelling). This may happen on the lips, face, tongue, mouth, or throat.  Sneezing.  Coughing.  Breathing loudly (wheezing).  Stuffy nose.  Tingling in the mouth.  A rash.  Itching.  Itchy, red, puffy areas of skin (hives).  Watery eyes.  Throwing up (vomiting).  Watery poop (diarrhea).  Dizziness.  Feeling faint or fainting.  Trouble breathing or swallowing.  A tight feeling in the chest.  A fast heartbeat. HOW ARE ALLERGIES DIAGNOSED? Allergies can be diagnosed with:  A medical and family history.  Skin tests.  Blood tests.  A food diary. A food diary is a record of all the foods, drinks, and  symptoms you have each day.  The results of an elimination diet. This diet involves making sure not to eat certain foods and then seeing what happens when you start eating them again. HOW ARE ALLERGIES TREATED? There is no cure for allergies, but allergic reactions can be treated with medicine. Severe reactions usually need to be treated at a hospital.  HOW CAN REACTIONS BE PREVENTED? The best way to prevent an allergic reaction is to avoid the thing you are allergic to. Allergy shots and medicines can also help prevent reactions in some cases.   This information is not intended to replace advice given to you by your health care provider. Make sure you discuss any questions you have with your health care provider.   Document Released: 05/14/2012 Document Revised: 02/07/2014 Document Reviewed: 10/29/2013 Elsevier Interactive Patient Education 2016 Elsevier Inc.  Pruritus Pruritus is an itching feeling. There are many different conditions and factors that can make your skin itchy. Dry skin is one of the most common causes of itching. Most cases of itching do not require medical attention. Itchy skin can turn into a rash.  HOME CARE INSTRUCTIONS  Watch your pruritus for any changes. Take these steps to help with your condition:  Skin Care  Moisturize your skin as needed. A moisturizer that contains petroleum jelly is best for keeping moisture in your skin.  Take or apply medicines only as directed by your health care provider. This may include:  Corticosteroid cream.  Anti-itch lotions.  Oral anti-histamines.  Apply cool compresses to the affected areas.  Try taking a bath with:  Epsom salts. Follow the instructions on the packaging. You can get these at your local pharmacy or grocery store.  Baking soda. Pour a small amount into the bath as directed by your health care provider.  Colloidal oatmeal. Follow the instructions on the packaging. You can get this at your local pharmacy  or grocery store.  Try applying baking soda paste to your skin. Stir water into baking soda until it reaches a paste-like consistency.   Do not scratch your skin.  Avoid hot showers or baths, which can make itching worse. A cold shower may help with itching as long as you use a moisturizer after.  Avoid scented soaps, detergents, and perfumes. Use gentle soaps, detergents, perfumes, and other cosmetic products. General Instructions  Avoid wearing tight clothes.  Keep a journal to help track what causes your itch. Write down:  What you eat.  What cosmetic products you use.  What you drink.  What you wear. This includes jewelry.  Use a humidifier. This keeps the air moist, which helps to prevent dry skin. SEEK MEDICAL CARE IF:  The itching does not go away after several days.  You sweat at night.  You have weight loss.  You are unusually thirsty.  You urinate more than normal.  You are more tired than normal.  You have abdominal pain.  Your skin tingles.  You feel weak.  Your skin or the whites of your eyes look  yellow (jaundice).  Your skin feels numb.   This information is not intended to replace advice given to you by your health care provider. Make sure you discuss any questions you have with your health care provider.   Document Released: 09/29/2010 Document Revised: 06/03/2014 Document Reviewed: 01/13/2014 Elsevier Interactive Patient Education [employer] Elsevier Inc.

## 2014-12-31 NOTE — ED Notes (Signed)
Patient c/o of ear pain and drainage that began Monday night denies diziness.  Denies taking medications for pain. Denies fever or chills.  Patient states pain 6/10.  Patient also c/o generalized body itching that began last night, denies any allergies, took benadryl last night.  Breathing even and unlabored patient NAD at this time.

## 2015-01-27 ENCOUNTER — Encounter (HOSPITAL_COMMUNITY): Payer: Self-pay | Admitting: Emergency Medicine

## 2015-01-27 ENCOUNTER — Emergency Department (HOSPITAL_COMMUNITY): Payer: No Typology Code available for payment source

## 2015-01-27 ENCOUNTER — Emergency Department (HOSPITAL_COMMUNITY)
Admission: EM | Admit: 2015-01-27 | Discharge: 2015-01-27 | Disposition: A | Discharge status: Home / Self Care | Payer: No Typology Code available for payment source | Attending: Emergency Medicine | Admitting: Emergency Medicine

## 2015-01-27 DIAGNOSIS — Z79818 Long term (current) use of other agents affecting estrogen receptors and estrogen levels: Secondary | ICD-10-CM | POA: Insufficient documentation

## 2015-01-27 DIAGNOSIS — Z3202 Encounter for pregnancy test, result negative: Secondary | ICD-10-CM | POA: Insufficient documentation

## 2015-01-27 DIAGNOSIS — B349 Viral infection, unspecified: Secondary | ICD-10-CM | POA: Insufficient documentation

## 2015-01-27 DIAGNOSIS — J069 Acute upper respiratory infection, unspecified: Secondary | ICD-10-CM | POA: Insufficient documentation

## 2015-01-27 DIAGNOSIS — G43909 Migraine, unspecified, not intractable, without status migrainosus: Secondary | ICD-10-CM | POA: Insufficient documentation

## 2015-01-27 LAB — COMPREHENSIVE METABOLIC PANEL
ALK PHOS: 49 U/L (ref 38–126)
ALT: 11 U/L — ABNORMAL LOW (ref 14–54)
ANION GAP: 7 (ref 5–15)
AST: 15 U/L (ref 15–41)
Albumin: 3.8 g/dL (ref 3.5–5.0)
BILIRUBIN TOTAL: 0.4 mg/dL (ref 0.3–1.2)
BUN: 12 mg/dL (ref 6–20)
CALCIUM: 9.4 mg/dL (ref 8.9–10.3)
CO2: 29 mmol/L (ref 22–32)
Chloride: 104 mmol/L (ref 101–111)
Creatinine, Ser: 0.86 mg/dL (ref 0.44–1.00)
GFR calc non Af Amer: >60 mL/min (ref >60)
Glucose, Bld: 95 mg/dL (ref 65–99)
POTASSIUM: 4.2 mmol/L (ref 3.5–5.1)
Sodium: 140 mmol/L (ref 135–145)
TOTAL PROTEIN: 7.3 g/dL (ref 6.5–8.1)

## 2015-01-27 LAB — LIPASE, BLOOD: Lipase: 30 U/L (ref 11–51)

## 2015-01-27 LAB — CBC
HEMATOCRIT: 37.1 % (ref 36.0–46.0)
HEMOGLOBIN: 12.1 g/dL (ref 12.0–15.0)
MCH: 29.4 pg (ref 26.0–34.0)
MCHC: 32.6 g/dL (ref 30.0–36.0)
MCV: 90 fL (ref 78.0–100.0)
Platelets: 379 10*3/uL (ref 150–400)
RBC: 4.12 MIL/uL (ref 3.87–5.11)
RDW: 14.1 % (ref 11.5–15.5)
WBC: 9.5 10*3/uL (ref 4.0–10.5)

## 2015-01-27 LAB — I-STAT BETA HCG BLOOD, ED (MC, WL, AP ONLY)

## 2015-01-27 MED ORDER — GUAIFENESIN ER 600 MG PO TB12: 600.0000 mg | ORAL_TABLET | Freq: Two times a day (BID) | ORAL | Status: DC

## 2015-01-27 MED ORDER — ONDANSETRON HCL 4 MG/2ML IJ SOLN
4.0000 mg | Freq: Once | INTRAMUSCULAR | Status: AC
  Administered 2015-01-27: 4 mg via INTRAVENOUS
  Filled 2015-01-27: qty 2

## 2015-01-27 MED ORDER — FLUTICASONE PROPIONATE 50 MCG/ACT NA SUSP: 2.0000 | Freq: Every day | NASAL | Status: DC

## 2015-01-27 MED ORDER — ONDANSETRON 4 MG PO TBDP: 4.0000 mg | ORAL_TABLET | Freq: Three times a day (TID) | ORAL | Status: DC | PRN

## 2015-01-27 MED ORDER — SODIUM CHLORIDE 0.9 % IV BOLUS (SEPSIS)
1000.0000 mL | Freq: Once | INTRAVENOUS | Status: AC
  Administered 2015-01-27: 1000 mL via INTRAVENOUS

## 2015-01-27 MED ORDER — BENZONATATE 100 MG PO CAPS: 100.0000 mg | ORAL_CAPSULE | Freq: Three times a day (TID) | ORAL | Status: DC

## 2015-01-27 NOTE — ED Provider Notes (Signed)
CSN: [id number]     Arrival date & time 01/27/15  1143 History   First MD Initiated Contact with Patient 01/27/15 1459     Chief Complaint  Patient presents with  . Cough  . Nausea  . Diarrhea    HPI   Ms. Person is an 19 y.o. female with no significant PMH who presents to the ED for evaluation of cough, sinus drainage, nausea, and diarrhea. She states that two days ago she began experiencing sinus drainage from her ears/nose into her throat with associated productive cough. She states that last night she began feeling nauseated. Denies emesis. She does endorse one episode of watery, nonbloody diarrhea. Denies abdominal pain. Denies dysuria urinary frequency/urgency, fever, chills. Denies sick contacts.   Past Medical History  Diagnosis Date  . Migraine    Past Surgical History  Procedure Laterality Date  . Tonsillectomy     Family History  Problem Relation Age of Onset  . Diabetes Other   . Asthma Other   . Cancer Other    Social History  Substance Use Topics  . Smoking status: Never Smoker   . Smokeless tobacco: None  . Alcohol Use: No   OB History    No data available     Review of Systems  All other systems reviewed and are negative.     Allergies  Review of patient's allergies indicates no known allergies.  Home Medications   Prior to Admission medications   Medication Sig Start Date End Date Taking? Authorizing Provider  norethindrone-ethinyl estradiol (MICROGESTIN FE 1/20) 1-20 MG-MCG tablet Take 1 tablet by mouth daily.   Yes Historical Provider, MD  diclofenac (VOLTAREN) 75 MG EC tablet Take 1 tablet (75 mg total) by mouth 2 (two) times daily. Patient not taking: Reported on 10/18/2014 09/11/14   Ozella Rocks, MD  fluticasone [facility]) 50 MCG/ACT nasal spray Place 2 sprays into both nostrils daily. Patient not taking: Reported on 01/27/2015 12/31/14   Mercedes Camprubi-Soms, PA-C  isometheptene-acetaminophen-dichloralphenazone (MIDRIN) [phone] MG  capsule Take 1 capsule by mouth 4 (four) times daily as needed for migraine. Maximum 5 capsules in 12 hours for migraine headaches, 8 capsules in 24 hours for tension headaches. Patient not taking: Reported on 01/27/2015 11/07/14   Linwood Dibbles, MD  loratadine (CLARITIN) 10 MG tablet Take 1 tablet (10 mg total) by mouth daily. Patient not taking: Reported on 01/27/2015 12/31/14   Mercedes Camprubi-Soms, PA-C   BP 110/63 mmHg  Pulse 88  Temp(Src) 97.8 F (36.6 C) (Oral)  Resp 16  SpO2 100%  LMP 01/18/2015 Physical Exam  Constitutional: She is oriented to person, place, and time. No distress.  HENT:  Right Ear: Tympanic membrane and external ear normal.  Left Ear: Tympanic membrane and external ear normal.  Nose: Mucosal edema present.  Mouth/Throat: Oropharynx is clear and moist and mucous membranes are normal. No oropharyngeal exudate or posterior oropharyngeal erythema.  Eyes: Conjunctivae and EOM are normal. Pupils are equal, round, and reactive to light.  Neck: Normal range of motion. Neck supple.  Cardiovascular: Normal rate, regular rhythm, normal heart sounds and intact distal pulses.   Pulmonary/Chest: Effort normal and breath sounds normal. No respiratory distress. She has no wheezes. She exhibits no tenderness.  Abdominal: Soft. Bowel sounds are normal. She exhibits no distension. There is no tenderness. There is no rebound and no guarding.  Musculoskeletal: She exhibits no edema.  Neurological: She is alert and oriented to person, place, and time. No cranial nerve deficit.  Skin: Skin is warm and dry. She is not diaphoretic.  Psychiatric: She has a normal mood and affect.  Nursing note and vitals reviewed.  Filed Vitals:   01/27/15 1154 01/27/15 1530  BP: 131/75 110/63  Pulse: 93 88  Temp: 97.8 F (36.6 C)   TempSrc: Oral   Resp: 16 16  SpO2: 99% 100%     ED Course  Procedures (including critical care time) Labs Review Labs Reviewed  COMPREHENSIVE METABOLIC PANEL -  Abnormal; Notable for the following:    ALT 11 (*)    All other components within normal limits  LIPASE, BLOOD  CBC  I-STAT BETA HCG BLOOD, ED ([facility], [facility], [facility] ONLY)    Imaging Review Dg Chest 2 View  01/27/2015  CLINICAL DATA:  Cough and upper back pain for 2 days EXAM: CHEST  2 VIEW COMPARISON:  None. FINDINGS: Lungs are clear. Heart size and pulmonary vascularity are normal. No adenopathy. No pneumothorax. No bone lesions. IMPRESSION: No abnormality noted. Electronically Signed   By: Bretta BangWilliam  Woodruff III M.D.   On: 01/27/2015 15:11   I have personally reviewed and evaluated these images and lab results as part of my medical decision-making.   EKG Interpretation None      MDM   Final diagnoses:  URI (upper respiratory infection)  Viral syndrome    Labs unremarkable. Pt is afebrile. CXR negative. Likely viral URI. Will give 1L NS bolus and zofran in the ED and PO challenge. Anticipate d/c home with supportive meds and PCP f/u.  Pt able to tolerate PO. VSS. STable for discharge. ER return precautions given.  Carlene CoriaSerena Y Anni Hocevar, PA-C 01/28/15 [id number]  Tilden FossaElizabeth Rees, MD 01/29/15 [phone]

## 2015-01-27 NOTE — ED Notes (Signed)
Pt tolerated water

## 2015-01-27 NOTE — Discharge Instructions (Signed)
Your labs and chest x-ray today were unremarkable. As we discussed, your symptoms are likely due to a virus and will just take time to feel better. I will give you several prescriptions to help with your symptoms. Drink plenty of fluids and stick to a simple diet until you feel better.  Take medications as prescribed. Return to the emergency room for worsening condition or new concerning symptoms. Follow up with your regular doctor. If you don't have a regular doctor use one of the numbers below to establish a primary care doctor.   Emergency Department Resource Guide 1) Find a Doctor and Pay Out of Pocket Although you won't have to find out who is covered by your insurance plan, it is a good idea to ask around and get recommendations. You will then need to call the office and see if the doctor you have chosen will accept you as a new patient and what types of options they offer for patients who are self-pay. Some doctors offer discounts or will set up payment plans for their patients who do not have insurance, but you will need to ask so you aren't surprised when you get to your appointment.  2) Contact Your Local Health Department Not all health departments have doctors that can see patients for sick visits, but many do, so it is worth a call to see if yours does. If you don't know where your local health department is, you can check in your phone book. The CDC also has a tool to help you locate your state's health department, and many state websites also have listings of all of their local health departments.  3) Find a Walk-in Clinic If your illness is not likely to be very severe or complicated, you may want to try a walk in clinic. These are popping up all over the country in pharmacies, drugstores, and shopping centers. They're usually staffed by nurse practitioners or physician assistants that have been trained to treat common illnesses and complaints. They're usually fairly quick and  inexpensive. However, if you have serious medical issues or chronic medical problems, these are probably not your best option.  No Primary Care Doctor: - Call Health Connect at  [phone] - they can help you locate a primary care doctor that  accepts your insurance, provides certain services, etc. - Physician Referral [phone]  Emergency Department Resource Guide 1) Find a Doctor and Pay Out of Pocket Although you won't have to find out who is covered by your insurance plan, it is a good idea to ask around and get recommendations. You will then need to call the office and see if the doctor you have chosen will accept you as a new patient and what types of options they offer for patients who are self-pay. Some doctors offer discounts or will set up payment plans for their patients who do not have insurance, but you will need to ask so you aren't surprised when you get to your appointment.  2) Contact Your Local Health Department Not all health departments have doctors that can see patients for sick visits, but many do, so it is worth a call to see if yours does. If you don't know where your local health department is, you can check in your phone book. The CDC also has a tool to help you locate your state's health department, and many state websites also have listings of all of their local health departments.  3) Find a Walk-in Clinic If your illness is  not likely to be very severe or complicated, you may want to try a walk in clinic. These are popping up all over the country in pharmacies, drugstores, and shopping centers. They're usually staffed by nurse practitioners or physician assistants that have been trained to treat common illnesses and complaints. They're usually fairly quick and inexpensive. However, if you have serious medical issues or chronic medical problems, these are probably not your best option.  No Primary Care Doctor: - Call Health Connect at  [phone] - they can  help you locate a primary care doctor that  accepts your insurance, provides certain services, etc. - Physician Referral Service- [phone]  Chronic Pain Problems: Organization         Address  Phone   Notes  [facility]  [phone] Patients need to be referred by their primary care doctor.   Medication Assistance: Organization         Address  Phone   Notes  [facility] [address] [place], [place] [address] [phone] --Must be a resident of [place] -- Must have NO insurance coverage whatsoever (no Medicaid/ Medicare, etc.) -- The pt. MUST have a primary care doctor that directs their care regularly and follows them in the community   Med[facility]ist  [phone]   [employer]  [phone]    Agencies that provide inexpensive medical care: Organization         Address  Phone   Notes  [facility]  [phone]   [facility]    [phone]   [facility] [address] [place], [place] [address] [phone]   [facility] [address], [place] [phone]   [facility]    [phone]   [facility]    [phone]) [phone]   [facility]  [address], [place] Phone:  [phone], Fax:  [phone] Hours of Operation:  9 am - 6 pm, M-F.  Also accepts Medicaid/Medicare and self-pay.  [facility]  [address], [place] Phone: [phone], Fax: [phone]. Hours of Operation:  8:30 am - 5:30 pm, M-F.  Also accepts Medicaid and self-pay.  [facility] [place] [address], [place] Phone: [phone]   [facility] [address], [place], [place] [phone], Ext. [phone] Mondays & Thursdays: 7-9 AM.  First 15 patients are seen on a first come, first serve basis.    Medicaid-accepting [facility]  [place] Providers:  Organization         Address  Phone   Notes  [facility] [address], [place] [phone] Also accepts self-pay patients.  [facility] [address], [place]  [phone]) [phone]   [facility] [address], [place] [phone]   [facility] [address] [place] [address], [place] [phone]   Renaye RakersVeita Bland [address], [place]   [phone] Only accepts [place] patients after they have their name applied to their card.   Self-Pay (no insurance) in [facility]:  Organization         Address  Phone   Notes  Sickle Cell Patients, [facility] [address], [place] [phone]   [facility] [address] [place], [place] [phone]   [facility] [place]  [address], [place] [phone]   [facility]/Dr. Osei-Bonsu  [address], [place] or [address], [place] [phone] Phone number for both [place] and [place] locations is the same.  [facility] [address] [place] [phone]   [facility] [address] [place] or [address] [phone]   [facility] [address], [place] [phone], phone; [phone], fax Sees patients 1st and 3rd Saturday of every month.  Must not qualify for public or private insurance (i.e. Medicaid, Medicare, [place] Health Choice, Veterans' Benefits)  Household income should be no more than 200% of the poverty level The clinic cannot treat you if you are pregnant or think you are pregnant  Sexually transmitted diseases are not treated at the clinic.

## 2015-01-27 NOTE — Progress Notes (Signed)
CM spoke with pt who confirms uninsured [place] resident with no pcp.  CM discussed and provided written information for uninsured accepting pcps, discussed the importance of pcp vs EDP services for f/u care, www.needymeds.org, www.goodrx.com, discounted pharmacies and other [place] resources such as [facility] , [facility], affordable care act, financial assistance, uninsured dental services, [place] med assist, DSS and  health department  Reviewed resources for [place] uninsured accepting pcps like [facility], [facility] [address], [facility] [place], [facility], local urgent care centers, [facility], [facility], general [facility] clinics, [facility], [facility] plus others, medication resources, [facility] out patient pharmacies and housing Pt voiced understanding and appreciation of resources provided   Provided [facility] contact information Pt agreed to a referral Cm completed referral Pt to be contact by [facility] clinical liason  

## 2015-01-27 NOTE — ED Notes (Signed)
Pt reports productive cough and nasal drainage for the past 2 days. Also began to have nausea with 1 episode emesis last night. Also having diarrhea.  Has tolerated fluids today.

## 2015-04-15 ENCOUNTER — Emergency Department (HOSPITAL_COMMUNITY)
Admission: EM | Admit: 2015-04-15 | Discharge: 2015-04-15 | Disposition: A | Discharge status: Home / Self Care | Payer: Medicaid Other | Attending: Emergency Medicine | Admitting: Emergency Medicine

## 2015-04-15 ENCOUNTER — Encounter (HOSPITAL_COMMUNITY): Payer: Self-pay | Admitting: Emergency Medicine

## 2015-04-15 DIAGNOSIS — G43909 Migraine, unspecified, not intractable, without status migrainosus: Secondary | ICD-10-CM | POA: Diagnosis present

## 2015-04-15 NOTE — ED Notes (Signed)
Per patient, states woke up with migraine this am-does not see anyone for them-did not take anything because "nothering works"

## 2015-08-03 ENCOUNTER — Encounter (HOSPITAL_COMMUNITY): Payer: Self-pay | Admitting: *Deleted

## 2015-08-03 ENCOUNTER — Inpatient Hospital Stay (HOSPITAL_COMMUNITY)
Admission: AD | Admit: 2015-08-03 | Discharge: 2015-08-03 | Disposition: A | Discharge status: Home / Self Care | Payer: BLUE CROSS/BLUE SHIELD | Source: Ambulatory Visit | Attending: Obstetrics and Gynecology | Admitting: Obstetrics and Gynecology

## 2015-08-03 DIAGNOSIS — N939 Abnormal uterine and vaginal bleeding, unspecified: Secondary | ICD-10-CM | POA: Insufficient documentation

## 2015-08-03 DIAGNOSIS — R109 Unspecified abdominal pain: Secondary | ICD-10-CM | POA: Diagnosis present

## 2015-08-03 DIAGNOSIS — N921 Excessive and frequent menstruation with irregular cycle: Secondary | ICD-10-CM

## 2015-08-03 HISTORY — DX: Chlamydial infection, unspecified: A74.9

## 2015-08-03 LAB — URINALYSIS, ROUTINE W REFLEX MICROSCOPIC
BILIRUBIN URINE: NEGATIVE
GLUCOSE, UA: NEGATIVE mg/dL
Ketones, ur: NEGATIVE mg/dL
Leukocytes, UA: NEGATIVE
Nitrite: NEGATIVE
PH: 6 (ref 5.0–8.0)
Protein, ur: NEGATIVE mg/dL
SPECIFIC GRAVITY, URINE: 1.02 (ref 1.005–1.030)

## 2015-08-03 LAB — URINE MICROSCOPIC-ADD ON: RBC / HPF: NONE SEEN RBC/hpf (ref 0–5)

## 2015-08-03 LAB — CBC
HCT: 34.4 % — ABNORMAL LOW (ref 36.0–46.0)
HEMOGLOBIN: 11.6 g/dL — AB (ref 12.0–15.0)
MCH: 29.2 pg (ref 26.0–34.0)
MCHC: 33.7 g/dL (ref 30.0–36.0)
MCV: 86.6 fL (ref 78.0–100.0)
Platelets: 299 10*3/uL (ref 150–400)
RBC: 3.97 MIL/uL (ref 3.87–5.11)
RDW: 14.7 % (ref 11.5–15.5)
WBC: 11.7 10*3/uL — ABNORMAL HIGH (ref 4.0–10.5)

## 2015-08-03 LAB — WET PREP, GENITAL
CLUE CELLS WET PREP: NONE SEEN
Sperm: NONE SEEN
TRICH WET PREP: NONE SEEN
YEAST WET PREP: NONE SEEN

## 2015-08-03 LAB — POCT PREGNANCY, URINE: PREG TEST UR: NEGATIVE

## 2015-08-03 NOTE — MAU Note (Addendum)
Pt C/O lower abd cramping x 2 months, has had dark red bloody discharge also for 2 months.  Has had regular periods during this time.  Pt started birth control pills last night - pt of [facility].

## 2015-08-03 NOTE — MAU Provider Note (Signed)
Chief Complaint: Abdominal Cramping and Vaginal Discharge   First Provider Initiated Contact with Patient 08/03/15 1603      SUBJECTIVE HPI: Paula Hawkins is a 20 y.o. G0P0000 at Unknown by LMP who presents to maternity admissions reporting 2 month history of lower abdominal cramping and brown/dark red colored thick, mucous discharge. Reports history of lower abdominal cramps with menstrual cycles but now the cramps are occuring daily, regardless of menstrual cycle. She has taken Midol, Advil, and Tylenol with no relief of her cramps. The only thing that helps is sleep. Patient is sexually active in a mutually monogamous relationship. History of chlamydia 2 years ago. She started birth control pill 1 day ago.   She denies changes in menstrual cycle, vaginal itching/burning, urinary symptoms, dizziness, n/v, or fever/chills.     HPI  Past Medical History  Diagnosis Date  . Migraine   . Chlamydia    Past Surgical History  Procedure Laterality Date  . Tonsillectomy     Social History   Social History  . Marital Status: Single    Spouse Name: N/A  . Number of Children: N/A  . Years of Education: N/A   Occupational History  . Not on file.   Social History Main Topics  . Smoking status: Never Smoker   . Smokeless tobacco: Not on file  . Alcohol Use: No  . Drug Use: No  . Sexual Activity: Yes    Birth Control/ Protection: Pill   Other Topics Concern  . Not on file   Social History Narrative   No current facility-administered medications on file prior to encounter.   Current Outpatient Prescriptions on File Prior to Encounter  Medication Sig Dispense Refill  . norethindrone-ethinyl estradiol (MICROGESTIN FE 1/20) 1-20 MG-MCG tablet Take 1 tablet by mouth at bedtime.      No Known Allergies  ROS:  Review of Systems  Constitutional: Negative for fever, chills and fatigue.  Respiratory: Negative for shortness of breath.   Cardiovascular: Negative for chest pain.   Gastrointestinal: Positive for abdominal pain. Negative for nausea, vomiting and constipation.  Genitourinary: Positive for vaginal bleeding, menstrual problem and pelvic pain. Negative for dysuria, flank pain, vaginal discharge, difficulty urinating and vaginal pain.  Neurological: Negative for dizziness and headaches.  Psychiatric/Behavioral: Negative.      I have reviewed patient's Past Medical Hx, Surgical Hx, Family Hx, Social Hx, medications and allergies.   Physical Exam   Patient Vitals for the past 24 hrs:  BP Temp Temp src Pulse Resp  08/03/15 1810 96/77 mmHg - - 74 18  08/03/15 1522 128/69 mmHg 97.8 F (36.6 C) Oral 97 18   Constitutional: Well-developed, well-nourished female in no acute distress.  Cardiovascular: normal rate Respiratory: normal effort GI: Abd soft, non-tender. Pos BS x 4 MS: Extremities nontender, no edema, normal ROM Neurologic: Alert and oriented x 4.  GU: Neg CVAT.  PELVIC EXAM: Cervix pink, visually closed, without lesion, scant thin white discharge, scant brown discharge noted on cotton swab, vaginal walls and external genitalia normal Bimanual exam: Cervix 0/long/high, firm, anterior, neg CMT, uterus nontender, nonenlarged, adnexa without tenderness, enlargement, or mass   LAB RESULTS Results for orders placed or performed during the hospital encounter of 08/03/15 (from the past 24 hour(s))  Urinalysis, Routine w reflex microscopic (not at [facility])     Status: Abnormal   Collection Time: 08/03/15  3:00 PM  Result Value Ref Range   Color, Urine YELLOW YELLOW   APPearance CLEAR CLEAR  Specific Gravity, Urine 1.020 1.005 - 1.030   pH 6.0 5.0 - 8.0   Glucose, UA NEGATIVE NEGATIVE mg/dL   Hgb urine dipstick SMALL (A) NEGATIVE   Bilirubin Urine NEGATIVE NEGATIVE   Ketones, ur NEGATIVE NEGATIVE mg/dL   Protein, ur NEGATIVE NEGATIVE mg/dL   Nitrite NEGATIVE NEGATIVE   Leukocytes, UA NEGATIVE NEGATIVE  Urine microscopic-add on     Status:  Abnormal   Collection Time: 08/03/15  3:00 PM  Result Value Ref Range   Squamous Epithelial / LPF 6-30 (A) NONE SEEN   WBC, UA 0-5 0 - 5 WBC/hpf   RBC / HPF NONE SEEN 0 - 5 RBC/hpf   Bacteria, UA MANY (A) NONE SEEN  Pregnancy, urine POC     Status: None   Collection Time: 08/03/15  3:52 PM  Result Value Ref Range   Preg Test, Ur NEGATIVE NEGATIVE  Wet prep, genital     Status: Abnormal   Collection Time: 08/03/15  4:10 PM  Result Value Ref Range   Yeast Wet Prep HPF POC NONE SEEN NONE SEEN   Trich, Wet Prep NONE SEEN NONE SEEN   Clue Cells Wet Prep HPF POC NONE SEEN NONE SEEN   WBC, Wet Prep HPF POC MODERATE (A) NONE SEEN   Sperm NONE SEEN   CBC     Status: Abnormal   Collection Time: 08/03/15  4:27 PM  Result Value Ref Range   WBC 11.7 (H) 4.0 - 10.5 K/uL   RBC 3.97 3.87 - 5.11 MIL/uL   Hemoglobin 11.6 (L) 12.0 - 15.0 g/dL   HCT 16.134.4 (L) 09.636.0 - 04.546.0 %   MCV 86.6 78.0 - 100.0 fL   MCH 29.2 26.0 - 34.0 pg   MCHC 33.7 30.0 - 36.0 g/dL   RDW 40.914.7 81.111.5 - 91.415.5 %   Platelets 299 150 - 400 K/uL       IMAGING No results found.  MAU Management/MDM: Ordered labs and reviewed results.  No acute abdomen noted, no evidence of pelvic infection. STD tests pending.  Pt to continue OCPs that were just started, if symptoms persist, f/u in [facility] for irregular bleeding/bleeding between periods.  Pt stable at time of discharge.  ASSESSMENT 1. Abnormal uterine bleeding (AUB)   2. Metrorrhagia     PLAN Discharge home   Medication List    TAKE these medications        MICROGESTIN FE 1/20 1-20 MG-MCG tablet  Generic drug:  norethindrone-ethinyl estradiol  Take 1 tablet by mouth at bedtime.       Follow-up Information    [facility] with [facility]   Specialty:  Obstetrics and Gynecology   Why:  Or with Gyn provider of your choice, Return to MAU as needed for emergencies   Contact information:   [address] [place]  [phone]      Sharen CounterLisa Leftwich-Kirby Certified Nurse-Midwife 08/03/2015  6:22 PM

## 2015-08-03 NOTE — Discharge Instructions (Signed)
Abnormal Uterine Bleeding Abnormal uterine bleeding can affect women at various stages in life, including teenagers, women in their reproductive years, pregnant women, and women who have reached menopause. Several kinds of uterine bleeding are considered abnormal, including:  Bleeding or spotting between periods.   Bleeding after sexual intercourse.   Bleeding that is heavier or more than normal.   Periods that last longer than usual.  Bleeding after menopause.  Many cases of abnormal uterine bleeding are minor and simple to treat, while others are more serious. Any type of abnormal bleeding should be evaluated by your health care provider. Treatment will depend on the cause of the bleeding. HOME CARE INSTRUCTIONS Monitor your condition for any changes. The following actions may help to alleviate any discomfort you are experiencing:  Avoid the use of tampons and douches as directed by your health care provider.  Change your pads frequently. You should get regular pelvic exams and Pap tests. Keep all follow-up appointments for diagnostic tests as directed by your health care provider.  SEEK MEDICAL CARE IF:   Your bleeding lasts more than 1 week.   You feel dizzy at times.  SEEK IMMEDIATE MEDICAL CARE IF:   You pass out.   You are changing pads every 15 to 30 minutes.   You have abdominal pain.  You have a fever.   You become sweaty or weak.   You are passing large blood clots from the vagina.   You start to feel nauseous and vomit. MAKE SURE YOU:   Understand these instructions.  Will watch your condition.  Will get help right away if you are not doing well or get worse.   This information is not intended to replace advice given to you by your health care provider. Make sure you discuss any questions you have with your health care provider.   Document Released: 01/17/2005 Document Revised: 01/22/2013 Document Reviewed: 08/16/2012 Elsevier Interactive  Patient Education 2016 Elsevier Inc. Metrorrhagia Metrorrhagia is bleeding from the uterus that happens irregularly but often. The bleeding generally happens between menstrual periods. HOME CARE INSTRUCTIONS Pay attention to any changes in your symptoms. Follow these instructions to help with your condition: Eating  Eat well-balanced meals. Include foods that are high in iron, such as liver, meat, shellfish, green leafy vegetables, and eggs.  If you become constipated:  Drink plenty of water.  Eat fruits and vegetables that are high in water and fiber, such as spinach, carrots, raspberries, apples, and mango. Medicines  Take over-the-counter and prescription medicines only as told by your health care provider.  Do not change medicines without talking with your health care provider.  Aspirin or medicines that contain aspirin may make the bleeding worse. Do not take those medicines:  During the week before your period.  During your period.  If you were prescribed iron pills, take them as told by your health care provider. Iron pills help to replace iron that your body loses because of this condition. Activity  If you need to change your sanitary pad or tampon more than one time every 2 hours:  Lie in bed with your feet raised (elevated).  Place a cold pack on your lower abdomen.  Rest as much as possible until the bleeding stops or slows down.  Do not try to lose weight until the bleeding has stopped and your blood iron level is back to normal. Other Instructions  For two months, write down:  When your period starts.  When your period ends.  When any abnormal bleeding occurs.  What problems you notice.  Keep all follow-up visits as told by your health care provider. This is important. SEEK MEDICAL CARE IF:  You get light-headed or weak.  You have nausea and vomiting.  You cannot eat or drink without vomiting.  You feel dizzy or have diarrhea while you are  taking medicine.  You are taking birth control pills or hormones, and you want to change them or stop taking them. SEEK IMMEDIATE MEDICAL CARE IF:  You develop a fever or chills.  You need to change your sanitary pad or tampon more than one time per hour.  Your bleeding becomesheavy.  Your flow contains clots.  You develop pain in your abdomen.  You lose consciousness.  You develop a rash.   This information is not intended to replace advice given to you by your health care provider. Make sure you discuss any questions you have with your health care provider.   Document Released: 01/17/2005 Document Revised: 10/08/2014 Document Reviewed: 04/14/2014 Elsevier Interactive Patient Education [employer] Elsevier Inc.

## 2015-08-04 LAB — RPR: RPR Ser Ql: NONREACTIVE

## 2015-08-04 LAB — HIV ANTIBODY (ROUTINE TESTING W REFLEX): HIV Screen 4th Generation wRfx: NONREACTIVE

## 2015-08-04 LAB — HEPATITIS C ANTIBODY (REFLEX): HCV Ab: <0.1 s/co ratio (ref 0.0–0.9)

## 2015-08-04 LAB — HCV COMMENT:

## 2015-08-05 LAB — GC/CHLAMYDIA PROBE AMP (~~LOC~~) NOT AT ARMC
Chlamydia: NEGATIVE
Neisseria Gonorrhea: NEGATIVE

## 2015-08-24 ENCOUNTER — Ambulatory Visit (INDEPENDENT_AMBULATORY_CARE_PROVIDER_SITE_OTHER): Payer: BLUE CROSS/BLUE SHIELD | Admitting: Obstetrics and Gynecology

## 2015-08-24 ENCOUNTER — Encounter: Payer: Self-pay | Admitting: Obstetrics and Gynecology

## 2015-08-24 VITALS — BP 130/82 | HR 83 | Ht 69.0 in | Wt 254.0 lb

## 2015-08-24 DIAGNOSIS — Z01419 Encounter for gynecological examination (general) (routine) without abnormal findings: Secondary | ICD-10-CM

## 2015-08-24 DIAGNOSIS — Z3202 Encounter for pregnancy test, result negative: Secondary | ICD-10-CM | POA: Diagnosis not present

## 2015-08-24 DIAGNOSIS — N939 Abnormal uterine and vaginal bleeding, unspecified: Secondary | ICD-10-CM

## 2015-08-24 LAB — POCT URINE PREGNANCY: PREG TEST UR: NEGATIVE

## 2015-08-24 MED ORDER — NORGESTIMATE-ETH ESTRADIOL 0.25-35 MG-MCG PO TABS: 1.0000 | ORAL_TABLET | Freq: Every day | ORAL | 6 refills | Status: DC

## 2015-08-24 NOTE — Progress Notes (Addendum)
Obstetrics and Gynecology Visit New Patient Evaluation  Appointment Date: 08/24/2015  OBGYN Clinic: [facility]  Primary Care Provider: No PCP Per Patient  Referring Provider: No ref. provider found  Chief Complaint:  Chief Complaint  Patient presents with  . Gynecologic Exam    History of Present Illness: Paula Hawkins is a 20 y.o. African-American G1 (Patient's last menstrual period was 08/15/2015 (exact date).), seen for the above chief complaint. Her past medical history is significant for headaches, h/o chlamydia, tobacco abuse, BMI 38  Patient states that for the past two months she's had irregular low level dark vaginal bleeding and discharge along with some lower abdominal cramps; prior to this she had regular, qmonth periods that weren't particularly heavy or painful and last about 5 days. She had been on OCPs since 2015 and was recently switched to a EE pill in early July by a [facility]. She went to the MAU on 7/3 for her s/s and had a normal CBC, HepC Ab, u/a, RPR, HIV, GC-CT and wet prep and a normal UPT; no ultrasound was done. She states she stopped her OCP a few days     No breast s/s, fevers, chills, chest pain, SOB, nausea, vomiting, abdominal pain, dysuria, hematuria, vaginal itching, diarrhea, constipation, blood in BMs  Review of Systems:Her 12 point review of systems is negative or as noted in the History of Present Illness.  Past Medical History:  Past Medical History:  Diagnosis Date  . Chlamydia   . Migraine   s/s more consistent with HA as she has no s/s of aura, prodromal s/s and aren't intractable  Past Surgical History:  Past Surgical History:  Procedure Laterality Date  . TONSILLECTOMY      Past Obstetrical History:  OB History    Gravida Para Term Preterm AB Living   1 0 0 0 0 0   SAB TAB Ectopic Multiple Live Births   0 0 0 0        Past Gynecological History: As per HPI. Menarche age 63 No. of abnormal pap smears   Social  History:  Social History   Social History  . Marital status: Single    Spouse name: N/A  . Number of children: N/A  . Years of education: N/A   Occupational History  . Not on file.   Social History Main Topics  . Smoking status: Never Smoker  . Smokeless tobacco: Never Used  . Alcohol use No  . Drug use: No  . Sexual activity: Yes    Birth control/ protection: Pill, Condom   Other Topics Concern  . Not on file   Social History Narrative  . No narrative on file    Family History:  Family History  Problem Relation Age of Onset  . Diabetes Other   . Asthma Other   . Cancer Other   . Asthma Father   . Diabetes Mother   . Asthma Mother   . Hypertension Neg Hx    Grandmother with breast cancer in her 6s   Medications: none  Allergies Review of patient's allergies indicates no known allergies.   Physical Exam:  BP 130/82   Pulse 83   Ht 5\' 9"  (1.753 m)   Wt 254 lb (115.2 kg)   LMP 08/15/2015 (Exact Date) Comment: 7-8 days long  BMI 37.51 kg/m  Body mass index is 37.51 kg/m. General appearance: Well nourished, well developed female in no acute distress.  Neck:  Supple, normal appearance, and no  thyromegaly  Cardiovascular: normal s1 and s2.  No murmurs, rubs or gallops. Respiratory:  Clear to auscultation bilateral. Normal respiratory effort Abdomen: positive bowel sounds and no masses, hernias; diffusely non tender to palpation, non distended Breasts: breasts appear normal, no suspicious masses, no skin or nipple changes or axillary nodes, and normal inspection. Neuro/Psych:  Normal mood and affect.  Skin:  Warm and dry.  Lymphatic:  No inguinal lymphadenopathy.   Pelvic exam: is not limited by body habitus EGBUS: within normal limits Vagina: within normal limits and with no blood in the vault, Cervix:  no lesions or cervical motion tenderness Uterus:  nonenlarged and approximately 6 week sized Adnexa:  normal adnexa and no mass, fullness,  tenderness Rectovaginal: deferred  Laboratory: UPT negative  Radiology: none  Assessment: AUB, lower abdominal pain  Plan:  *Well Woman: first pap today. Tobacco cessation, weight loss encouraged, including increased risk of VTE if used in conjunction with OCPs and her weight. *AUB: d/w her that sometimes can get BTB with chronic OCP use but given it's length and negative w/u thus far that a TVUS is reasonable. Will order this but in the mean time put her on a EE monophasic pill to see if that helps with her AUB and cramping. Increased risk of VTE of increasing EE dose, along with her obesity and tobacco abuse d/w pt and she would like to proceed. 10 minutes  RTC w/in 3wks for f/u after u/s.   Cornelia Copa MD Attending [facility])

## 2015-08-25 DIAGNOSIS — N939 Abnormal uterine and vaginal bleeding, unspecified: Secondary | ICD-10-CM

## 2015-08-25 HISTORY — DX: Abnormal uterine and vaginal bleeding, unspecified: N93.9

## 2015-08-25 LAB — PAP LB, RFX HPV ASCU: PAP Smear Comment: 0

## 2015-09-18 ENCOUNTER — Encounter (INDEPENDENT_AMBULATORY_CARE_PROVIDER_SITE_OTHER): Payer: Self-pay

## 2015-09-18 ENCOUNTER — Ambulatory Visit: Payer: BLUE CROSS/BLUE SHIELD | Admitting: Certified Nurse Midwife

## 2015-09-18 ENCOUNTER — Ambulatory Visit (HOSPITAL_COMMUNITY)
Admission: RE | Admit: 2015-09-18 | Discharge: 2015-09-18 | Disposition: A | Discharge status: Home / Self Care | Payer: BLUE CROSS/BLUE SHIELD | Source: Ambulatory Visit | Attending: Obstetrics and Gynecology | Admitting: Obstetrics and Gynecology

## 2015-09-18 VITALS — BP 131/87 | HR 112 | Temp 99.1°F | Wt 245.0 lb

## 2015-09-18 DIAGNOSIS — N939 Abnormal uterine and vaginal bleeding, unspecified: Secondary | ICD-10-CM | POA: Diagnosis not present

## 2015-09-18 DIAGNOSIS — R935 Abnormal findings on diagnostic imaging of other abdominal regions, including retroperitoneum: Secondary | ICD-10-CM | POA: Insufficient documentation

## 2015-09-22 NOTE — Progress Notes (Signed)
Patient not seen.  [facility], [facility] her based on her US results for what step is needed next.

## 2015-10-26 ENCOUNTER — Ambulatory Visit: Payer: BLUE CROSS/BLUE SHIELD | Admitting: Family

## 2016-03-25 ENCOUNTER — Emergency Department (HOSPITAL_COMMUNITY)
Admission: EM | Admit: 2016-03-25 | Discharge: 2016-03-25 | Disposition: A | Discharge status: Home / Self Care | Payer: BLUE CROSS/BLUE SHIELD | Attending: Emergency Medicine | Admitting: Emergency Medicine

## 2016-03-25 ENCOUNTER — Encounter (HOSPITAL_COMMUNITY): Payer: Self-pay | Admitting: *Deleted

## 2016-03-25 DIAGNOSIS — M62830 Muscle spasm of back: Secondary | ICD-10-CM | POA: Insufficient documentation

## 2016-03-25 MED ORDER — METHOCARBAMOL 500 MG PO TABS: 500.0000 mg | ORAL_TABLET | Freq: Two times a day (BID) | ORAL | 0 refills | Status: DC

## 2016-03-25 MED ORDER — NAPROXEN 500 MG PO TABS: 500.0000 mg | ORAL_TABLET | Freq: Two times a day (BID) | ORAL | 0 refills | Status: DC
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
## 2016-03-25 NOTE — Discharge Instructions (Signed)
Your back pain is most likely due to spasms of your back muscles.    Please take naproxen (anti-inflammatory) and robaxin (muscle relaxer) for 5 days.  Rest for the next two days, and on day 3 you may slowly resume normal activity.  Use heating pad to help with spasms.   Return to emergency department if you develop fever, nausea,vomiting, abdominal pain, urinary symptoms or lower extremity numbness, weakness or tingling

## 2016-03-25 NOTE — ED Triage Notes (Signed)
Pt reports L thoracic pain since Wednesday night when she woke up to go to work.  Pt reports pain is worse with certain position, movement and when taking a deep breath.

## 2016-07-11 ENCOUNTER — Telehealth: Payer: Self-pay | Admitting: *Deleted

## 2016-07-11 NOTE — Telephone Encounter (Signed)
Patient called for her US results- she had lost her insurance and had not been able to follow up. She has family planning and wants to follow up now. Reviewed US with patient and let her know that the Family planning is not going to help with what she needs. Reassured her that she is OK - but that she will need to address the problem to stop the AUB. She is going to look into getting insurance that will cover more than birth control.

## 2016-08-08 ENCOUNTER — Telehealth: Payer: Self-pay

## 2016-08-08 NOTE — Telephone Encounter (Signed)
[facility]. She was to get this done August 2017 but didn't have any insurance.  Request sent to the Dr. Vergie LivingPickens.

## 2016-08-09 NOTE — Telephone Encounter (Signed)
She needs to come in for an MD visit with a [place] doctor for annual first. Thanks!

## 2016-08-09 NOTE — Telephone Encounter (Signed)
Please call pt to schedule appt

## 2016-08-31 ENCOUNTER — Encounter: Payer: Self-pay | Admitting: Obstetrics & Gynecology

## 2016-08-31 ENCOUNTER — Ambulatory Visit (INDEPENDENT_AMBULATORY_CARE_PROVIDER_SITE_OTHER): Payer: PRIVATE HEALTH INSURANCE | Admitting: Obstetrics & Gynecology

## 2016-08-31 VITALS — BP 144/94 | HR 88 | Temp 97.6°F | Wt 260.0 lb

## 2016-08-31 DIAGNOSIS — N939 Abnormal uterine and vaginal bleeding, unspecified: Secondary | ICD-10-CM | POA: Diagnosis not present

## 2016-08-31 MED ORDER — NORGESTIMATE-ETH ESTRADIOL 0.25-35 MG-MCG PO TABS: 1.0000 | ORAL_TABLET | Freq: Every day | ORAL | 6 refills | Status: DC

## 2016-08-31 NOTE — Patient Instructions (Signed)
Sonohysterogram A sonohysterogram is a procedure to examine the inside of the uterus. This exam uses sound waves that are sent to a computer to make images of the lining of the uterus (endometrium). To get the best images, a germ-free, salt-water solution (sterile saline) is put into the uterus through the vagina. You may have this procedure if you have certain reproductive problems, such as abnormal bleeding, infertility, or miscarriage. This procedure can show what may be causing these problems. Possible causes include scarring or abnormal growths such as fibroids inside your uterus. It can also show if your uterus is an abnormal shape or if the lining of the uterus is too thin. Tell a health care provider about:  All medicines you are taking, including vitamins, herbs, eye drops, creams, and over-the-counter medicines.  Any allergies you have.  Any blood disorders you have.  Any surgeries you have had.  Any medical conditions you have.  Whether you are pregnant or may be pregnant.  The date of the first day of your last period.  Any signs of infection, such as fever, pain in your lower abdomen, or abnormal discharge from your vagina. What are the risks? Generally, this is a safe procedure. However, problems may occur, including:  Abdominal pain or cramping.  Light bleeding (spotting).  Increased vaginal discharge.  Infection. What happens before the procedure?  Your health care provider may have you take an over-the-counter pain medicine.  You may be given medicine to stop any abnormal bleeding.  You may be given antibiotic medicine to help prevent infection.  You may be asked to take a pregnancy test. This is usually in the form of a urine test.  You may have a pelvic exam.  You will be asked to empty your bladder. What happens during the procedure?  You will lie down on the exam table with your feet in stirrups or with your knees bent and your feet flat on the  table.  A slender, handheld device (transducer) will be lubricated and placed into your vagina.  The transducer will be positioned to send sound waves to your uterus. The sound waves are sent to a computer and are turned into images, which your health care provider sees during the procedure.  The transducer will be removed from your vagina.  An instrument will be inserted to widen the opening of your vagina (speculum).  A swab with germ-killing solution (antiseptic) will be used to clean the opening to your uterus (cervix).  A long, thin tube (catheter) will be placed through your cervix into your uterus.  The speculum will be removed.  The transducer will be placed back into your vagina to take more images.  Your uterus will be filled with a germ-free, salt-water solution (sterile saline) through the catheter. You may feel some cramping.  A fluid that contains air bubbles may be sent through the catheter to make it easier to see the fallopian tubes.  The transducer and catheter will be removed. The procedure may vary among health care providers and hospitals. What happens after the procedure?  It is up to you to get the results of your procedure. Ask your health care provider, or the department that is doing the procedure, when your results will be ready. Summary  A sonohysterogram is a procedure that creates images of the inside of the uterus.  The risks of this procedure are very low. Most women experience cramping and spotting after the procedure.  You may need to have a   pelvic exam and take a pregnancy test before this procedure. This procedure will not be done if you are pregnant or have an infection. This information is not intended to replace advice given to you by your health care provider. Make sure you discuss any questions you have with your health care provider. Document Released: 06/03/2013 Document Revised: 12/14/2015 Document Reviewed: 12/14/2015 Elsevier  Interactive Patient Education  2017 [employer].  

## 2016-08-31 NOTE — Progress Notes (Signed)
Patient presents for annual Exam.  Complains of headaches x 10 days, denies dizziness, auroras, nausea. She needs to be scheduled for a D&C with a female.

## 2016-08-31 NOTE — Progress Notes (Signed)
Patient ID: Paula Hawkins, female   DOB: 1995-02-04, 21 y.o.   MRN: [id number]  Cc: BTB on OCP  HPI Paula Hawkins is a 21 y.o. female.  G1P0000 Patient's last menstrual period was 08/21/2016 (exact date). She has had intermenstrual bleeding and cramps and US 09/2015 showed possible polyp.  HPI  Past Medical History:  Diagnosis Date  . Abnormal uterine bleeding unrelated to menstrual cycle 08/25/2015  . Chlamydia   . Migraine     Past Surgical History:  Procedure Laterality Date  . TONSILLECTOMY      Family History  Problem Relation Age of Onset  . Diabetes Other   . Asthma Other   . Cancer Other   . Asthma Father   . Diabetes Mother   . Asthma Mother   . Hypertension Neg Hx     Social History Social History  Substance Use Topics  . Smoking status: Heavy Tobacco Smoker  . Smokeless tobacco: Never Used  . Alcohol use Yes     Comment: occasionally    No Known Allergies  Current Outpatient Prescriptions  Medication Sig Dispense Refill  . methocarbamol (ROBAXIN) 500 MG tablet Take 1 tablet (500 mg total) by mouth 2 (two) times daily. 20 tablet 0  . naproxen (NAPROSYN) 500 MG tablet Take 1 tablet (500 mg total) by mouth 2 (two) times daily. 30 tablet 0  . norgestimate-ethinyl estradiol (ORTHO-CYCLEN,SPRINTEC,PREVIFEM) 0.25-35 MG-MCG tablet Take 1 tablet by mouth daily. 2 Package 6   No current facility-administered medications for this visit.     Review of Systems Review of Systems  Constitutional: Negative.   Respiratory: Negative.   Gastrointestinal: Negative.   Genitourinary: Positive for menstrual problem, pelvic pain and vaginal bleeding.    Blood pressure (!) 144/94, pulse 88, temperature 97.6 F (36.4 C), weight 260 lb (117.9 kg), last menstrual period 08/21/2016.  Physical Exam Physical Exam  Constitutional: She appears well-developed. No distress.  Cardiovascular: Normal rate.   Pulmonary/Chest: Effort normal.  Genitourinary:  Genitourinary  Comments: pelvic deferred, pap nl 1 year ago  Psychiatric: She has a normal mood and affect. Her behavior is normal.  Vitals reviewed.   Data Reviewed  CLINICAL DATA:  21 year old female with abnormal uterine bleeding for 2 months and pelvic pain. Negative urine pregnancy test 08/24/2015. LMP 08/15/2015.  EXAM: TRANSABDOMINAL AND TRANSVAGINAL ULTRASOUND OF PELVIS  TECHNIQUE: Both transabdominal and transvaginal ultrasound examinations of the pelvis were performed. Transabdominal technique was performed for global imaging of the pelvis including uterus, ovaries, adnexal regions, and pelvic cul-de-sac. It was necessary to proceed with endovaginal exam following the transabdominal exam to visualize the endometrium and adnexa.  COMPARISON:  None  FINDINGS: Uterus  Measurements: 7.8 x 3.8 x 5.2 cm. Anteverted uterus is normal in size and configuration. No uterine fibroids or other myometrial abnormalities.  Endometrium  Thickness: 5 mm. Suggestion of a mildly hyperechoic 1.6 x 0.5 x 1.0 cm mass in the mid endometrial cavity with associated feeding vessel on color Doppler. Trace endometrial cavity fluid.  Right ovary  Measurements: 3.7 x 1.6 x 2.3 cm. Normal appearance/no adnexal mass.  Left ovary  Measurements: 3.5 x 1.8 x 3.4 cm. Normal appearance/no adnexal mass.  Other findings  No abnormal free fluid.  IMPRESSION: 1. Probable endometrial polyp measuring 1.6 x 0.5 x 1.0 cm with internal feeding vessel on color Doppler. Further evaluation with sonohysterography, hysteroscopy and/or D&C should be considered, as clinically warranted. 2. No uterine fibroids. 3. Normal ovaries.  No adnexal masses.  Electronically Signed   By: Delbert PhenixJason A Poff M.D.   On: 09/18/2015 13:45 Assessment    BTB on OCP with small polyp seen last year on US    Plan    sonohysterogram RTC after procedure Continue OCP       Scheryl DarterJames Yuniel Blaney 08/31/2016, 3:55 PM

## 2016-09-23 ENCOUNTER — Ambulatory Visit (HOSPITAL_COMMUNITY)
Admission: RE | Admit: 2016-09-23 | Discharge: 2016-09-23 | Disposition: A | Discharge status: Home / Self Care | Payer: PRIVATE HEALTH INSURANCE | Source: Ambulatory Visit | Attending: Obstetrics & Gynecology | Admitting: Obstetrics & Gynecology

## 2016-09-23 DIAGNOSIS — N939 Abnormal uterine and vaginal bleeding, unspecified: Secondary | ICD-10-CM

## 2016-09-23 DIAGNOSIS — N858 Other specified noninflammatory disorders of uterus: Secondary | ICD-10-CM | POA: Insufficient documentation

## 2016-09-28 ENCOUNTER — Encounter: Payer: Self-pay | Admitting: Obstetrics and Gynecology

## 2016-09-28 ENCOUNTER — Ambulatory Visit (INDEPENDENT_AMBULATORY_CARE_PROVIDER_SITE_OTHER): Payer: PRIVATE HEALTH INSURANCE | Admitting: Obstetrics and Gynecology

## 2016-09-28 VITALS — BP 132/82 | HR 107 | Ht 69.0 in | Wt 263.7 lb

## 2016-09-28 DIAGNOSIS — Z7189 Other specified counseling: Secondary | ICD-10-CM

## 2016-09-28 DIAGNOSIS — Z712 Person consulting for explanation of examination or test findings: Secondary | ICD-10-CM

## 2016-09-28 DIAGNOSIS — N840 Polyp of corpus uteri: Secondary | ICD-10-CM

## 2016-09-28 NOTE — Progress Notes (Signed)
21 yo here to discuss results of sonohysterography. She reports persistent breakthrough bleeding mid-way through OCP pack. She denies any pelvic pain or abnormal discharge.   Past Medical History:  Diagnosis Date  . Abnormal uterine bleeding unrelated to menstrual cycle 08/25/2015  . Chlamydia   . Migraine    Past Surgical History:  Procedure Laterality Date  . TONSILLECTOMY     Family History  Problem Relation Age of Onset  . Diabetes Other   . Asthma Other   . Cancer Other   . Asthma Father   . Diabetes Mother   . Asthma Mother   . Hypertension Neg Hx    Social History  Substance Use Topics  . Smoking status: Heavy Tobacco Smoker  . Smokeless tobacco: Never Used  . Alcohol use Yes     Comment: occasionally   ROS See pertinent in HPI  Blood pressure 132/82, pulse (!) 107, height 5\' 9"  (1.753 m), weight 263 lb 11.2 oz (119.6 kg), last menstrual period 09/17/2016. GENERAL: Well-developed, well-nourished female in no acute distress.  NEURO: alert and oriented x 3  09/23/2016 sonohyst FINDINGS: Endometrium was distended with saline. There is a 1.5 x 0.8 x 1.3 cm soft tissue mass within the lower aspect of the endometrium with internal color vascularity. No additional endometrial masses were identified.  IMPRESSION: There is a 1.5 cm endometrial soft tissue mass with associated vascularity concerning for endometrial polyp.   Electronically Signed   By: Annia Beltrew  Davis M.D.   On: 09/23/2016 12:19   A/P 21 yo with endometrial polyp - Reviewed results of ultrasound with the patient - Discussed surgical management with D&C. Risks, benefits and alternatives were explained including but not limited to risks of bleeding, infection, uterine perforation or damage to adjacent organs. Patient verbalized understanding and is ready for this intervention as it has been going on for a year now. All questions were answered.  - Patient will be scheduled for D&C with hysteroscopy -  patient advised to continue using OCP

## 2016-09-29 ENCOUNTER — Encounter (HOSPITAL_COMMUNITY): Payer: Self-pay

## 2016-11-16 ENCOUNTER — Encounter (HOSPITAL_BASED_OUTPATIENT_CLINIC_OR_DEPARTMENT_OTHER): Payer: Self-pay | Admitting: *Deleted

## 2016-11-22 NOTE — H&P (Signed)
Paula Hawkins is an 21 y.o. female G0 with DUB and endometrial polyp seen on sonohysterogram here for D&C with hysteroscopy. Patient is doing well without complaints. She currently taking COC for the medical management of her irregular bleeding. She continues to experience an abnormal bleeding pattern with COC.  Pertinent Gynecological History: Menses: twice monthly Bleeding: dysfunctional uterine bleeding Contraception: OCP (estrogen/progesterone) DES exposure: denies Blood transfusions: none Sexually transmitted diseases: history of chlamydia Previous GYN Procedures: none  Last mammogram: n/a Last pap: normal Date: 08/2015 OB History: G0, P0   Menstrual History: Patient's last menstrual period was 11/12/2016 (exact date).    Past Medical History:  Diagnosis Date  . Abnormal uterine bleeding unrelated to menstrual cycle 08/25/2015  . Chlamydia   . Migraine    migraines    Past Surgical History:  Procedure Laterality Date  . TONSILLECTOMY      Family History  Problem Relation Age of Onset  . Diabetes Other   . Asthma Other   . Cancer Other   . Asthma Father   . Diabetes Mother   . Asthma Mother   . Hypertension Neg Hx     Social History:  reports that she has been smoking.  She has never used smokeless tobacco. She reports that she drinks alcohol. She reports that she does not use drugs.  Allergies: No Known Allergies  Prescriptions Prior to Admission  Medication Sig Dispense Refill Last Dose  . norgestimate-ethinyl estradiol (ORTHO-CYCLEN,SPRINTEC,PREVIFEM) 0.25-35 MG-MCG tablet Take 1 tablet by mouth daily. 2 Package 6 11/22/2016 at Unknown time    ROS See pertinent in HPI Blood pressure 125/68, pulse 88, temperature 98.7 F (37.1 C), temperature source Oral, resp. rate 16, height 5\' 9"  (1.753 m), weight 264 lb (119.7 kg), last menstrual period 11/12/2016, SpO2 100 %. Physical Exam GENERAL: Well-developed, well-nourished female in no acute distress.  HEENT:  Normocephalic, atraumatic. Sclerae anicteric.  NECK: Supple. Normal thyroid.  LUNGS: Clear to auscultation bilaterally.  HEART: Regular rate and rhythm. ABDOMEN: Soft, nontender, nondistended. PELVIC: Deferred to OR EXTREMITIES: No cyanosis, clubbing, or edema, 2+ distal pulses.  Results for orders placed or performed during the hospital encounter of 11/23/16 (from the past 24 hour(s))  Pregnancy, urine POC     Status: None   Collection Time: 11/23/16 10:06 AM  Result Value Ref Range   Preg Test, Ur NEGATIVE NEGATIVE  Hemoglobin-hemacue, POC     Status: None   Collection Time: 11/23/16 10:48 AM  Result Value Ref Range   Hemoglobin 12.5 12.0 - 15.0 g/dL    No results found. 09/23/2016 sonohyst FINDINGS: Endometrium was distended with saline. There is a 1.5 x 0.8 x 1.3 cm soft tissue mass within the lower aspect of the endometrium with internal color vascularity. No additional endometrial masses were identified.  IMPRESSION: There is a 1.5 cm endometrial soft tissue mass with associated vascularity concerning for endometrial polyp.   Electronically Signed By: Annia Beltrew Davis M.D. On: 09/23/2016 12:19  Assessment/Plan: 21 yo with endometrial polyp and associated DUB here for D&C with hysteroscopy - Risks, benefits and alternatives were explained including but not limited to risks of bleeding, infection, uterine perforation, and damage to adjacent organs. Patient verbalized understanding and all questions were answered.   Yerlin Gasparyan 11/23/2016, 11:12 AM

## 2016-11-23 ENCOUNTER — Ambulatory Visit (HOSPITAL_BASED_OUTPATIENT_CLINIC_OR_DEPARTMENT_OTHER): Payer: PRIVATE HEALTH INSURANCE | Admitting: Certified Registered"

## 2016-11-23 ENCOUNTER — Encounter (HOSPITAL_BASED_OUTPATIENT_CLINIC_OR_DEPARTMENT_OTHER)
Admission: RE | Disposition: A | Discharge status: Home / Self Care | Payer: Self-pay | Source: Ambulatory Visit | Attending: Obstetrics and Gynecology

## 2016-11-23 ENCOUNTER — Ambulatory Visit (HOSPITAL_BASED_OUTPATIENT_CLINIC_OR_DEPARTMENT_OTHER)
Admission: RE | Admit: 2016-11-23 | Discharge: 2016-11-23 | Disposition: A | Discharge status: Home / Self Care | Payer: PRIVATE HEALTH INSURANCE | Source: Ambulatory Visit | Attending: Obstetrics and Gynecology | Admitting: Obstetrics and Gynecology

## 2016-11-23 ENCOUNTER — Encounter (HOSPITAL_BASED_OUTPATIENT_CLINIC_OR_DEPARTMENT_OTHER): Payer: Self-pay | Admitting: Anesthesiology

## 2016-11-23 DIAGNOSIS — N938 Other specified abnormal uterine and vaginal bleeding: Secondary | ICD-10-CM | POA: Diagnosis not present

## 2016-11-23 DIAGNOSIS — N939 Abnormal uterine and vaginal bleeding, unspecified: Secondary | ICD-10-CM | POA: Diagnosis present

## 2016-11-23 DIAGNOSIS — Z793 Long term (current) use of hormonal contraceptives: Secondary | ICD-10-CM | POA: Diagnosis not present

## 2016-11-23 DIAGNOSIS — F17210 Nicotine dependence, cigarettes, uncomplicated: Secondary | ICD-10-CM | POA: Insufficient documentation

## 2016-11-23 HISTORY — PX: HYSTEROSCOPY WITH D & C: SHX1775

## 2016-11-23 LAB — POCT PREGNANCY, URINE: PREG TEST UR: NEGATIVE

## 2016-11-23 LAB — POCT HEMOGLOBIN-HEMACUE: Hemoglobin: 12.5 g/dL (ref 12.0–15.0)

## 2016-11-23 SURGERY — DILATATION AND CURETTAGE /HYSTEROSCOPY
Anesthesia: General | Site: Vagina

## 2016-11-23 MED ORDER — DEXAMETHASONE SODIUM PHOSPHATE 10 MG/ML IJ SOLN
INTRAMUSCULAR | Status: DC | PRN
  Administered 2016-11-23: 10 mg via INTRAVENOUS

## 2016-11-23 MED ORDER — KETOROLAC TROMETHAMINE 30 MG/ML IJ SOLN
INTRAMUSCULAR | Status: DC | PRN
  Administered 2016-11-23: 30 mg via INTRAVENOUS

## 2016-11-23 MED ORDER — PROPOFOL 10 MG/ML IV BOLUS
INTRAVENOUS | Status: AC
  Filled 2016-11-23: qty 20

## 2016-11-23 MED ORDER — SODIUM CHLORIDE 0.9 % IR SOLN
Status: DC | PRN
  Administered 2016-11-23: 1

## 2016-11-23 MED ORDER — IBUPROFEN 600 MG PO TABS: 600.0000 mg | ORAL_TABLET | Freq: Four times a day (QID) | ORAL | 4 refills | Status: DC | PRN

## 2016-11-23 MED ORDER — ONDANSETRON HCL 4 MG/2ML IJ SOLN
INTRAMUSCULAR | Status: DC | PRN
  Administered 2016-11-23: 4 mg via INTRAVENOUS

## 2016-11-23 MED ORDER — MEPERIDINE HCL 25 MG/ML IJ SOLN: 6.2500 mg | INTRAMUSCULAR | Status: DC | PRN

## 2016-11-23 MED ORDER — FENTANYL CITRATE (PF) 100 MCG/2ML IJ SOLN
INTRAMUSCULAR | Status: AC
  Filled 2016-11-23: qty 2

## 2016-11-23 MED ORDER — CHLOROPROCAINE HCL 1 % IJ SOLN
INTRAMUSCULAR | Status: AC
  Filled 2016-11-23: qty 30

## 2016-11-23 MED ORDER — OXYCODONE HCL 5 MG PO TABS: 5.0000 mg | ORAL_TABLET | Freq: Once | ORAL | Status: DC | PRN

## 2016-11-23 MED ORDER — MIDAZOLAM HCL 2 MG/2ML IJ SOLN
INTRAMUSCULAR | Status: AC
  Filled 2016-11-23: qty 2

## 2016-11-23 MED ORDER — LACTATED RINGERS IV SOLN
INTRAVENOUS | Status: DC
  Administered 2016-11-23: 11:00:00 via INTRAVENOUS

## 2016-11-23 MED ORDER — MIDAZOLAM HCL 2 MG/2ML IJ SOLN
1.0000 mg | INTRAMUSCULAR | Status: DC | PRN
  Administered 2016-11-23: 2 mg via INTRAVENOUS

## 2016-11-23 MED ORDER — FENTANYL CITRATE (PF) 100 MCG/2ML IJ SOLN
25.0000 ug | INTRAMUSCULAR | Status: DC | PRN
  Administered 2016-11-23 (×2): 50 ug via INTRAVENOUS

## 2016-11-23 MED ORDER — LIDOCAINE HCL (CARDIAC) 20 MG/ML IV SOLN
INTRAVENOUS | Status: DC | PRN
  Administered 2016-11-23: 100 mg via INTRAVENOUS

## 2016-11-23 MED ORDER — SCOPOLAMINE 1 MG/3DAYS TD PT72: 1.0000 | MEDICATED_PATCH | Freq: Once | TRANSDERMAL | Status: DC | PRN

## 2016-11-23 MED ORDER — PROPOFOL 10 MG/ML IV BOLUS
INTRAVENOUS | Status: DC | PRN
  Administered 2016-11-23: 200 mg via INTRAVENOUS

## 2016-11-23 MED ORDER — METOCLOPRAMIDE HCL 5 MG/ML IJ SOLN: 10.0000 mg | Freq: Once | INTRAMUSCULAR | Status: DC | PRN

## 2016-11-23 MED ORDER — LIDOCAINE 2% (20 MG/ML) 5 ML SYRINGE
INTRAMUSCULAR | Status: AC
  Filled 2016-11-23: qty 5

## 2016-11-23 MED ORDER — FENTANYL CITRATE (PF) 100 MCG/2ML IJ SOLN
50.0000 ug | INTRAMUSCULAR | Status: AC | PRN
  Administered 2016-11-23 (×2): 25 ug via INTRAVENOUS
  Administered 2016-11-23: 50 ug via INTRAVENOUS

## 2016-11-23 MED ORDER — OXYCODONE HCL 5 MG/5ML PO SOLN: 5.0000 mg | Freq: Once | ORAL | Status: DC | PRN

## 2016-11-23 MED ORDER — OXYCODONE-ACETAMINOPHEN 5-325 MG PO TABS: 1.0000 | ORAL_TABLET | Freq: Four times a day (QID) | ORAL | 0 refills | Status: DC | PRN

## 2016-11-23 SURGICAL SUPPLY — 15 items
CATH ROBINSON RED A/P 16FR (CATHETERS) ×3 IMPLANT
CONTAINER PREFILL 10% NBF 60ML (FORM) ×6 IMPLANT
GLOVE BIO SURGEON STRL SZ 6.5 (GLOVE) ×1 IMPLANT
GLOVE BIO SURGEONS STRL SZ 6.5 (GLOVE) ×1
GLOVE BIOGEL PI IND STRL 6.5 (GLOVE) ×1 IMPLANT
GLOVE BIOGEL PI IND STRL 7.0 (GLOVE) ×1 IMPLANT
GLOVE BIOGEL PI INDICATOR 6.5 (GLOVE) ×2
GLOVE BIOGEL PI INDICATOR 7.0 (GLOVE) ×2
GLOVE SURG SS PI 6.0 STRL IVOR (GLOVE) ×3 IMPLANT
GOWN STRL REUS W/TWL LRG LVL3 (GOWN DISPOSABLE) ×6 IMPLANT
PACK VAGINAL MINOR WOMEN LF (CUSTOM PROCEDURE TRAY) ×3 IMPLANT
PAD OB MATERNITY 4.3X12.25 (PERSONAL CARE ITEMS) ×3 IMPLANT
TOWEL OR 17X24 6PK STRL BLUE (TOWEL DISPOSABLE) ×6 IMPLANT
TUBING AQUILEX INFLOW (TUBING) ×3 IMPLANT
TUBING AQUILEX OUTFLOW (TUBING) ×3 IMPLANT

## 2016-11-23 NOTE — Discharge Instructions (Signed)
Post Anesthesia Home Care Instructions  Activity: Get plenty of rest for the remainder of the day. A responsible individual must stay with you for 24 hours following the procedure.  For the next 24 hours, DO NOT: -Drive a car -Advertising copywriter -Drink alcoholic beverages -Take any medication unless instructed by your physician -Make any legal decisions or sign important papers.  Meals: Start with liquid foods such as gelatin or soup. Progress to regular foods as tolerated. Avoid greasy, spicy, heavy foods. If nausea and/or vomiting occur, drink only clear liquids until the nausea and/or vomiting subsides. Call your physician if vomiting continues.  Special Instructions/Symptoms: Your throat may feel dry or sore from the anesthesia or the breathing tube placed in your throat during surgery. If this causes discomfort, gargle with warm salt water. The discomfort should disappear within 24 hours.  If you had a scopolamine patch placed behind your ear for the management of post- operative nausea and/or vomiting:  1. The medication in the patch is effective for 72 hours, after which it should be removed.  Wrap patch in a tissue and discard in the trash. Wash hands thoroughly with soap and water. 2. You may remove the patch earlier than 72 hours if you experience unpleasant side effects which may include dry mouth, dizziness or visual disturbances. 3. Avoid touching the patch. Wash your hands with soap and water after contact with the patch.      Dilation and Curettage or Vacuum Curettage Dilation and curettage (D&C) and vacuum curettage are minor procedures. A D&C involves stretching (dilation) the cervix and scraping (curettage) the inside lining of the uterus (endometrium). During a D&C, tissue is gently scraped from the endometrium, starting from the top portion of the uterus down to the lowest part of the uterus (cervix). During a vacuum curettage, the lining and tissue in the uterus are  removed with the use of gentle suction. Curettage may be performed to either diagnose or treat a problem. As a diagnostic procedure, curettage is performed to examine tissues from the uterus. A diagnostic curettage may be done if you have:  Irregular bleeding in the uterus.  Bleeding with the development of clots.  Spotting between menstrual periods.  Prolonged menstrual periods or other abnormal bleeding.  Bleeding after menopause.  No menstrual period (amenorrhea).  A change in size and shape of the uterus.  Abnormal endometrial cells discovered during a Pap test.  As a treatment procedure, curettage may be performed for the following reasons:  Removal of an IUD (intrauterine device).  Removal of retained placenta after giving birth.  Abortion.  Miscarriage.  Removal of endometrial polyps.  Removal of uncommon types of noncancerous lumps (fibroids).  Tell a health care provider about:  Any allergies you have, including allergies to prescribed medicine or latex.  All medicines you are taking, including vitamins, herbs, eye drops, creams, and over-the-counter medicines. This is especially important if you take any blood-thinning medicine. Bring a list of all of your medicines to your appointment.  Any problems you or family members have had with anesthetic medicines.  Any blood disorders you have.  Any surgeries you have had.  Your medical history and any medical conditions you have.  Whether you are pregnant or may be pregnant.  Recent vaginal infections you have had.  Recent menstrual periods, bleeding problems you have had, and what form of birth control (contraception) you use. What are the risks? Generally, this is a safe procedure. However, problems may occur, including:  Infection.  Heavy vaginal bleeding.  Allergic reactions to medicines.  Damage to the cervix or other structures or organs.  Development of scar tissue (adhesions) inside the  uterus, which can cause abnormal amounts of menstrual bleeding. This may make it harder to get pregnant in the future.  A hole (perforation) or puncture in the uterine wall. This is rare.  What happens before the procedure? Staying hydrated Follow instructions from your health care provider about hydration, which may include:  Up to 2 hours before the procedure - you may continue to drink clear liquids, such as water, clear fruit juice, black coffee, and plain tea.  Eating and drinking restrictions Follow instructions from your health care provider about eating and drinking, which may include:  8 hours before the procedure - stop eating heavy meals or foods such as meat, fried foods, or fatty foods.  6 hours before the procedure - stop eating light meals or foods, such as toast or cereal.  6 hours before the procedure - stop drinking milk or drinks that contain milk.  2 hours before the procedure - stop drinking clear liquids. If your health care provider told you to take your medicine(s) on the day of your procedure, take them with only a sip of water.  Medicines  Ask your health care provider about: ? Changing or stopping your regular medicines. This is especially important if you are taking diabetes medicines or blood thinners. ? Taking medicines such as aspirin and ibuprofen. These medicines can thin your blood. Do not take these medicines before your procedure if your health care provider instructs you not to.  You may be given antibiotic medicine to help prevent infection. General instructions  For 24 hours before your procedure, do not: ? Douche. ? Use tampons. ? Use medicines, creams, or suppositories in the vagina. ? Have sexual intercourse.  You may be given a pregnancy test on the day of the procedure.  Plan to have someone take you home from the hospital or clinic.  You may have a blood or urine sample taken.  If you will be going home right after the procedure,  plan to have someone with you for 24 hours. What happens during the procedure?  To reduce your risk of infection: ? Your health care team will wash or sanitize their hands. ? Your skin will be washed with soap.  An IV tube will be inserted into one of your veins.  You will be given one of the following: ? A medicine that numbs the area in and around the cervix (local anesthetic). ? A medicine to make you fall asleep (general anesthetic).  You will lie down on your back, with your feet in foot rests (stirrups).  The size and position of your uterus will be checked.  A lubricated instrument (speculum or Sims retractor) will be inserted into the back side of your vagina. The speculum will be used to hold apart the walls of your vagina so your health care provider can see your cervix.  A tool (tenaculum) will be attached to the lip of the cervix to stabilize it.  Your cervix will be softened and dilated. This may be done by: ? Taking a medicine. ? Having tapered dilators or thin rods (laminaria) or gradual widening instruments (tapered dilators) inserted into your cervix.  A small, sharp, curved instrument (curette) will be used to scrape a small amount of tissue or cells from the endometrium or cervical canal. In some cases, gentle suction is applied with  the curette. The curette will then be removed. The cells will be taken to a lab for testing. The procedure may vary among health care providers and hospitals. What happens after the procedure?  You may have mild cramping, backache, pain, and light bleeding or spotting. You may pass small blood clots from your vagina.  You may have to wear compression stockings. These stockings help to prevent blood clots and reduce swelling in your legs.  Your blood pressure, heart rate, breathing rate, and blood oxygen level will be monitored until the medicines you were given have worn off. Summary  Dilation and curettage (D&C) involves stretching  (dilation) the cervix and scraping (curettage) the inside lining of the uterus (endometrium).  After the procedure, you may have mild cramping, backache, pain, and light bleeding or spotting. You may pass small blood clots from your vagina.  Plan to have someone take you home from the hospital or clinic. This information is not intended to replace advice given to you by your health care provider. Make sure you discuss any questions you have with your health care provider. Document Released: 01/17/2005 Document Revised: 10/04/2015 Document Reviewed: 10/04/2015 Elsevier Interactive Patient Education  2018 [employer]   Hysteroscopy, Care After Refer to this sheet in the next few weeks. These instructions provide you with information on caring for yourself after your procedure. Your health care provider may also give you more specific instructions. Your treatment has been planned according to current medical practices, but problems sometimes occur. Call your health care provider if you have any problems or questions after your procedure. What can I expect after the procedure? After your procedure, it is typical to have the following:  You may have some cramping. This normally lasts for a couple days.  You may have bleeding. This can vary from light spotting for a few days to menstrual-like bleeding for 3-7 days.  Follow these instructions at home:  Rest for the first 1-2 days after the procedure.  Only take over-the-counter or prescription medicines as directed by your health care provider. Do not take aspirin. It can increase the chances of bleeding.  Take showers instead of baths for 2 weeks or as directed by your health care provider.  Do not drive for 24 hours or as directed.  Do not drink alcohol while taking pain medicine.  Do not use tampons, douche, or have sexual intercourse for 2 weeks or until your health care provider says it is okay.  Take your temperature twice a day  for 4-5 days. Write it down each time.  Follow your health care provider's advice about diet, exercise, and lifting.  If you develop constipation, you may: ? Take a mild laxative if your health care provider approves. ? Add bran foods to your diet. ? Drink enough fluids to keep your urine clear or pale yellow.  Try to have someone with you or available to you for the first 24-48 hours, especially if you were given a general anesthetic.  Follow up with your health care provider as directed. Contact a health care provider if:  You feel dizzy or lightheaded.  You feel sick to your stomach (nauseous).  You have abnormal vaginal discharge.  You have a rash.  You have pain that is not controlled with medicine. Get help right away if:  You have bleeding that is heavier than a normal menstrual period.  You have a fever.  You have increasing cramps or pain, not controlled with medicine.  You have  new belly (abdominal) pain.  You pass out.  You have pain in the tops of your shoulders (shoulder strap areas).  You have shortness of breath. This information is not intended to replace advice given to you by your health care provider. Make sure you discuss any questions you have with your health care provider. Document Released: 11/07/2012 Document Revised: 06/25/2015 Document Reviewed: 08/16/2012 Elsevier Interactive Patient Education  2017 [employer].

## 2016-11-23 NOTE — Anesthesia Postprocedure Evaluation (Signed)
Anesthesia Post Note  Patient: Paula Hawkins  Procedure(s) Performed: DILATATION AND CURETTAGE /HYSTEROSCOPY (N/A Vagina )     Patient location during evaluation: PACU Anesthesia Type: General Level of consciousness: awake and alert and oriented Pain management: pain level controlled Vital Signs Assessment: post-procedure vital signs reviewed and stable Respiratory status: spontaneous breathing, nonlabored ventilation and respiratory function stable Cardiovascular status: blood pressure returned to baseline and stable Postop Assessment: no apparent nausea or vomiting Anesthetic complications: no    Last Vitals:  Vitals:   11/23/16 1230 11/23/16 1245  BP: (!) 139/94 (!) 141/89  Pulse: 76 80  Resp: 13 17  Temp:    SpO2: 100% 100%    Last Pain:  Vitals:   11/23/16 1230  TempSrc:   PainSc: 1                  Dylin Breeden A.

## 2016-11-23 NOTE — Op Note (Signed)
PREOPERATIVE DIAGNOSIS:  Abnormal uterine bleeding. POSTOPERATIVE DIAGNOSIS: The same PROCEDURE: Hysteroscopy, Dilation and Curettage. SURGEON:  Dr. Catalina AntiguaPeggy Loyce Klasen   INDICATIONS: 21 y.o. G0P0000  here for scheduled surgery for D&C with hysteroscopy.   Risks of surgery were discussed with the patient including but not limited to: bleeding which may require transfusion; infection which may require antibiotics; injury to uterus or surrounding organs; intrauterine scarring which may impair future fertility; need for additional procedures including laparotomy or laparoscopy; and other postoperative/anesthesia complications. Written informed consent was obtained.    FINDINGS:  An 8-week size uterus.  Diffuse proliferative endometrium.  Normal ostia bilaterally.  ANESTHESIA:   General INTRAVENOUS FLUIDS:  500 ml of LR FLUID DEFICITS:  275 ml of normal saline ESTIMATED BLOOD LOSS:  Less than 20 ml SPECIMENS: Endometrial curettings sent to pathology COMPLICATIONS:  None immediate.  PROCEDURE DETAILS:  The patient was taken to the operating room where general anesthesia was administered and was found to be adequate.  After an adequate timeout was performed, she was placed in the dorsal lithotomy position and examined; then prepped and draped in the sterile manner.   Her bladder was catheterized for an unmeasured amount of clear, yellow urine. A speculum was then placed in the patient's vagina and a single tooth tenaculum was applied to the anterior lip of the cervix.  The cervix was sounded to 8 cm and dilated manually with Hagar dilators to accommodate the 5 mm diagnostic hysteroscope.  Once the cervix was dilated, the hysteroscope was inserted under direct visualization using sodium chloride as a suspension medium.  The uterine cavity was carefully examined, both ostia were recognized, and diffusely proliferative endometrium was noted.   After further careful visualization of the uterine cavity, the  hysteroscope was removed under direct visualization.  A sharp curettage was then performed to obtain a moderate amount of endometrial curettings.  The tenaculum was removed from the anterior lip of the cervix and the vaginal speculum was removed after noting good hemostasis.  The patient tolerated the procedure well and was taken to the recovery area awake, extubated and in stable condition.

## 2016-11-23 NOTE — Anesthesia Procedure Notes (Signed)
Procedure Name: LMA Insertion Performed by: Karen KitchensKELLY, Cyra Spader M Pre-anesthesia Checklist: Patient identified, Emergency Drugs available, Suction available, Patient being monitored and Timeout performed Preoxygenation: Pre-oxygenation with 100% oxygen Induction Type: IV induction LMA: LMA inserted LMA Size: 4.0 Tube type: Oral Number of attempts: 1 Placement Confirmation: positive ETCO2,  CO2 detector and breath sounds checked- equal and bilateral Tube secured with: Tape Dental Injury: Teeth and Oropharynx as per pre-operative assessment

## 2016-11-23 NOTE — Anesthesia Preprocedure Evaluation (Signed)
Anesthesia Evaluation  Patient identified by MRN, date of birth, ID band Patient awake    Reviewed: Allergy & Precautions, NPO status , Patient's Chart, lab work & pertinent test results  Airway Mallampati: III  TM Distance: >3 FB Neck ROM: Full    Dental no notable dental hx. (+) Teeth Intact   Pulmonary Current Smoker,    Pulmonary exam normal breath sounds clear to auscultation       Cardiovascular negative cardio ROS Normal cardiovascular exam Rhythm:Regular Rate:Normal     Neuro/Psych  Headaches, negative psych ROS   GI/Hepatic negative GI ROS, Neg liver ROS,   Endo/Other  Morbid obesity  Renal/GU negative Renal ROS  negative genitourinary   Musculoskeletal negative musculoskeletal ROS (+)   Abdominal (+) + obese,   Peds  Hematology negative hematology ROS (+)   Anesthesia Other Findings   Reproductive/Obstetrics DUB                             Anesthesia Physical Anesthesia Plan  ASA: III  Anesthesia Plan: General   Post-op Pain Management:    Induction: Intravenous  PONV Risk Score and Plan: 4 or greater and Ondansetron, Dexamethasone, Midazolam, Scopolamine patch - Pre-op and Propofol infusion  Airway Management Planned: LMA  Additional Equipment:   Intra-op Plan:   Post-operative Plan: Extubation in OR  Informed Consent: I have reviewed the patients History and Physical, chart, labs and discussed the procedure including the risks, benefits and alternatives for the proposed anesthesia with the patient or authorized representative who has indicated his/her understanding and acceptance.   Dental advisory given  Plan Discussed with: CRNA, Anesthesiologist and Surgeon  Anesthesia Plan Comments:         Anesthesia Quick Evaluation

## 2016-11-23 NOTE — Transfer of Care (Signed)
Immediate Anesthesia Transfer of Care Note  Patient: Lowanda FosterAlizae D Fennimore  Procedure(s) Performed: DILATATION AND CURETTAGE /HYSTEROSCOPY (N/A Vagina )  Patient Location: PACU  Anesthesia Type:General  Level of Consciousness: awake, alert  and sedated  Airway & Oxygen Therapy: Patient Spontanous Breathing and Patient connected to face mask oxygen  Post-op Assessment: Report given to RN and Post -op Vital signs reviewed and stable  Post vital signs: Reviewed and stable  Last Vitals:  Vitals:   11/23/16 1029  BP: 125/68  Pulse: 88  Resp: 16  Temp: 37.1 C  SpO2: 100%    Last Pain:  Vitals:   11/23/16 1029  TempSrc: Oral      Patients Stated Pain Goal: 3 (11/23/16 1029)  Complications: No apparent anesthesia complications

## 2016-11-24 ENCOUNTER — Encounter (HOSPITAL_BASED_OUTPATIENT_CLINIC_OR_DEPARTMENT_OTHER): Payer: Self-pay | Admitting: Obstetrics and Gynecology

## 2016-12-08 ENCOUNTER — Encounter: Payer: Self-pay | Admitting: Obstetrics and Gynecology

## 2016-12-08 ENCOUNTER — Ambulatory Visit (INDEPENDENT_AMBULATORY_CARE_PROVIDER_SITE_OTHER): Payer: PRIVATE HEALTH INSURANCE | Admitting: Obstetrics and Gynecology

## 2016-12-08 VITALS — BP 140/81 | HR 91 | Ht 69.0 in | Wt 271.0 lb

## 2016-12-08 DIAGNOSIS — Z9889 Other specified postprocedural states: Secondary | ICD-10-CM

## 2016-12-08 NOTE — Progress Notes (Signed)
21 yo G0 here for post op check s/p D& C hysteroscopy, polypectomy on 10/24. Patient reports feeling well since her procedure with minimal bleeding or pain. She is still taking COC for contraception and cycle control  Past Medical History:  Diagnosis Date  . Abnormal uterine bleeding unrelated to menstrual cycle 08/25/2015  . Chlamydia   . Migraine    migraines   Past Surgical History:  Procedure Laterality Date  . TONSILLECTOMY     Family History  Problem Relation Age of Onset  . Diabetes Other   . Asthma Other   . Cancer Other   . Asthma Father   . Diabetes Mother   . Asthma Mother   . Hypertension Neg Hx    Social History   Tobacco Use  . Smoking status: Current Some Day Smoker    Types: Cigars  . Smokeless tobacco: Never Used  Substance Use Topics  . Alcohol use: Yes    Comment: occasionally  . Drug use: No   ROS See pertinent in HPI  Blood pressure 140/81, pulse 91, height 5\' 9"  (1.753 m), weight 271 lb (122.9 kg), last menstrual period 11/12/2016. GENERAL: Well-developed, well-nourished female in no acute distress.  ABDOMEN: Soft, nontender, nondistended.  PELVIC: Not performed. EXTREMITIES: No cyanosis, clubbing, or edema, 2+ distal pulses.  A/P 21 yo here for post op check - Patient is doing well - Pathology results reviewed with the patient - patient is medically cleared to resume all activities of daily living - patient advised to keep a menstrual calendar and to return with abnormal bleeding - patient with normal pap smear in 08/2015

## 2017-01-16 ENCOUNTER — Other Ambulatory Visit: Payer: Self-pay

## 2017-01-16 ENCOUNTER — Encounter (HOSPITAL_COMMUNITY): Payer: Self-pay | Admitting: Emergency Medicine

## 2017-01-16 ENCOUNTER — Ambulatory Visit (HOSPITAL_COMMUNITY)
Admission: EM | Admit: 2017-01-16 | Discharge: 2017-01-16 | Disposition: A | Discharge status: Home / Self Care | Payer: PRIVATE HEALTH INSURANCE | Attending: Emergency Medicine | Admitting: Emergency Medicine

## 2017-01-16 DIAGNOSIS — N9489 Other specified conditions associated with female genital organs and menstrual cycle: Secondary | ICD-10-CM

## 2017-01-16 DIAGNOSIS — N939 Abnormal uterine and vaginal bleeding, unspecified: Secondary | ICD-10-CM | POA: Diagnosis not present

## 2017-01-16 DIAGNOSIS — Z809 Family history of malignant neoplasm, unspecified: Secondary | ICD-10-CM | POA: Insufficient documentation

## 2017-01-16 DIAGNOSIS — Z79891 Long term (current) use of opiate analgesic: Secondary | ICD-10-CM | POA: Diagnosis not present

## 2017-01-16 DIAGNOSIS — Z3202 Encounter for pregnancy test, result negative: Secondary | ICD-10-CM

## 2017-01-16 DIAGNOSIS — Z9889 Other specified postprocedural states: Secondary | ICD-10-CM | POA: Insufficient documentation

## 2017-01-16 DIAGNOSIS — Z791 Long term (current) use of non-steroidal anti-inflammatories (NSAID): Secondary | ICD-10-CM | POA: Diagnosis not present

## 2017-01-16 DIAGNOSIS — R197 Diarrhea, unspecified: Secondary | ICD-10-CM

## 2017-01-16 DIAGNOSIS — Z825 Family history of asthma and other chronic lower respiratory diseases: Secondary | ICD-10-CM | POA: Diagnosis not present

## 2017-01-16 DIAGNOSIS — F17290 Nicotine dependence, other tobacco product, uncomplicated: Secondary | ICD-10-CM | POA: Diagnosis not present

## 2017-01-16 DIAGNOSIS — Z79899 Other long term (current) drug therapy: Secondary | ICD-10-CM | POA: Diagnosis not present

## 2017-01-16 DIAGNOSIS — Z833 Family history of diabetes mellitus: Secondary | ICD-10-CM | POA: Insufficient documentation

## 2017-01-16 DIAGNOSIS — R51 Headache: Secondary | ICD-10-CM | POA: Diagnosis not present

## 2017-01-16 DIAGNOSIS — G441 Vascular headache, not elsewhere classified: Secondary | ICD-10-CM

## 2017-01-16 DIAGNOSIS — R109 Unspecified abdominal pain: Secondary | ICD-10-CM | POA: Insufficient documentation

## 2017-01-16 DIAGNOSIS — R102 Pelvic and perineal pain: Secondary | ICD-10-CM

## 2017-01-16 DIAGNOSIS — R110 Nausea: Secondary | ICD-10-CM | POA: Diagnosis not present

## 2017-01-16 LAB — POCT URINALYSIS DIP (DEVICE)
Glucose, UA: NEGATIVE mg/dL
Leukocytes, UA: NEGATIVE
Nitrite: NEGATIVE
PH: 6 (ref 5.0–8.0)
PROTEIN: NEGATIVE mg/dL
Urobilinogen, UA: 0.2 mg/dL (ref 0.0–1.0)

## 2017-01-16 LAB — POCT PREGNANCY, URINE: Preg Test, Ur: NEGATIVE

## 2017-01-16 MED ORDER — ONDANSETRON 4 MG PO TBDP
ORAL_TABLET | ORAL | Status: AC
  Filled 2017-01-16: qty 1

## 2017-01-16 MED ORDER — KETOROLAC TROMETHAMINE 60 MG/2ML IM SOLN
60.0000 mg | Freq: Once | INTRAMUSCULAR | Status: AC
  Administered 2017-01-16: 60 mg via INTRAMUSCULAR

## 2017-01-16 MED ORDER — NAPROXEN 375 MG PO TABS: 375.0000 mg | ORAL_TABLET | Freq: Two times a day (BID) | ORAL | 0 refills | Status: DC

## 2017-01-16 MED ORDER — ONDANSETRON 4 MG PO TBDP
4.0000 mg | ORAL_TABLET | Freq: Once | ORAL | Status: AC
  Administered 2017-01-16: 4 mg via ORAL

## 2017-01-16 MED ORDER — ONDANSETRON HCL 4 MG PO TABS: 4.0000 mg | ORAL_TABLET | Freq: Four times a day (QID) | ORAL | 0 refills | Status: DC

## 2017-01-16 MED ORDER — KETOROLAC TROMETHAMINE 60 MG/2ML IM SOLN
INTRAMUSCULAR | Status: AC
  Filled 2017-01-16: qty 2

## 2017-01-16 NOTE — ED Provider Notes (Signed)
[facility]    CSN: [id number] Arrival date & time: 01/16/17  1607     History   Chief Complaint Chief Complaint  Patient presents with  . Abdominal Cramping    HPI Lowanda Fosterlizae D Oettinger is a 21 y.o. female.   21 year old female complaining of pelvic cramping for 2 weeks. It is associated with occasional diarrhea, headache and nausea. She states her menses began yesterday. LMP 12/11/2016. Chart notes that she has a history of a AUB unrelated to menstrual cycle.      Past Medical History:  Diagnosis Date  . Abnormal uterine bleeding unrelated to menstrual cycle 08/25/2015  . Chlamydia   . Migraine    migraines    Patient Active Problem List   Diagnosis Date Noted  . Abnormal uterine bleeding unrelated to menstrual cycle 08/25/2015    Past Surgical History:  Procedure Laterality Date  . HYSTEROSCOPY W/D&C N/A 11/23/2016   Procedure: DILATATION AND CURETTAGE /HYSTEROSCOPY;  Surgeon: Catalina Antiguaonstant, Peggy, MD;  Location: [facility];  Service: Gynecology;  Laterality: N/A;  . TONSILLECTOMY      OB History    Gravida Para Term Preterm AB Living   0 0 0 0 0 0   SAB TAB Ectopic Multiple Live Births   0 0 0 0         Home Medications    Prior to Admission medications   Medication Sig Start Date End Date Taking? Authorizing Provider  ibuprofen (ADVIL,MOTRIN) 600 MG tablet Take 1 tablet (600 mg total) by mouth every 6 (six) hours as needed. Patient not taking: Reported on 12/08/2016 11/23/16   Constant, Peggy, MD  norgestimate-ethinyl estradiol (ORTHO-CYCLEN,SPRINTEC,PREVIFEM) 0.25-35 MG-MCG tablet Take 1 tablet by mouth daily. 08/31/16   Adam PhenixArnold, James G, MD  oxyCODONE-acetaminophen (PERCOCET/ROXICET) 5-325 MG tablet Take 1-2 tablets by mouth every 6 (six) hours as needed. Patient not taking: Reported on 12/08/2016 11/23/16   Constant, Peggy, MD    Family History Family History  Problem Relation Age of Onset  . Diabetes Other   . Asthma Other   .  Cancer Other   . Asthma Father   . Diabetes Mother   . Asthma Mother   . Hypertension Neg Hx     Social History Social History   Tobacco Use  . Smoking status: Current Some Day Smoker    Types: Cigars  . Smokeless tobacco: Never Used  Substance Use Topics  . Alcohol use: Yes    Comment: occasionally  . Drug use: No     Allergies   Patient has no known allergies.   Review of Systems Review of Systems  Constitutional: Positive for activity change and fatigue.  HENT: Negative.   Respiratory: Negative.   Gastrointestinal: Positive for diarrhea and nausea.  Genitourinary: Positive for pelvic pain. Negative for dysuria and frequency.       As per HPI  Neurological: Positive for headaches.  All other systems reviewed and are negative.    Physical Exam Triage Vital Signs ED Triage Vitals  Enc Vitals Group     BP 01/16/17 1620 126/79     Pulse Rate 01/16/17 1620 (!) 114     Resp 01/16/17 1620 18     Temp 01/16/17 1620 99.1 F (37.3 C)     Temp src --      SpO2 01/16/17 1620 100 %     Weight --      Height --      Head Circumference --  Peak Flow --      Pain Score 01/16/17 1621 9     Pain Loc --      Pain Edu? --      Excl. in GC? --    No data found.  Updated Vital Signs BP 126/79   Pulse (!) 114   Temp 99.1 F (37.3 C)   Resp 18   LMP 01/15/2017   SpO2 100%   Visual Acuity Right Eye Distance:   Left Eye Distance:   Bilateral Distance:    Right Eye Near:   Left Eye Near:    Bilateral Near:     Physical Exam   UC Treatments / Results  Labs (all labs ordered are listed, but only abnormal results are displayed) Labs Reviewed - No data to display  EKG  EKG Interpretation None       Radiology No results found.  Procedures Procedures (including critical care time)  Medications Ordered in UC Medications - No data to display   Initial Impression / Assessment and Plan / UC Course  I have reviewed the triage vital signs and the  nursing notes.  Pertinent labs & imaging results that were available during my care of the patient were reviewed by me and considered in my medical decision making (see chart for details).       Final Clinical Impressions(s) / UC Diagnoses   Final diagnoses:  None    ED Discharge Orders    None       Controlled Substance Prescriptions [place] Controlled Substance Registry consulted? Not Applicable   Hayden RasmussenMabe, Marshayla Mitschke, NP 01/16/17 1801

## 2017-01-16 NOTE — ED Triage Notes (Signed)
Pt states she started her period yesterday[facility] C/o severe cramping.

## 2017-01-17 LAB — URINE CYTOLOGY ANCILLARY ONLY
CHLAMYDIA, DNA PROBE: NEGATIVE
NEISSERIA GONORRHEA: NEGATIVE
Trichomonas: NEGATIVE

## 2017-04-13 ENCOUNTER — Encounter (HOSPITAL_COMMUNITY): Payer: Self-pay

## 2017-04-13 ENCOUNTER — Other Ambulatory Visit: Payer: Self-pay

## 2017-04-13 DIAGNOSIS — R109 Unspecified abdominal pain: Secondary | ICD-10-CM | POA: Insufficient documentation

## 2017-04-13 DIAGNOSIS — Z5321 Procedure and treatment not carried out due to patient leaving prior to being seen by health care provider: Secondary | ICD-10-CM | POA: Insufficient documentation

## 2017-04-13 DIAGNOSIS — R05 Cough: Secondary | ICD-10-CM | POA: Insufficient documentation

## 2017-04-13 LAB — CBC
HCT: 39.7 % (ref 36.0–46.0)
Hemoglobin: 13.3 g/dL (ref 12.0–15.0)
MCH: 30.9 pg (ref 26.0–34.0)
MCHC: 33.5 g/dL (ref 30.0–36.0)
MCV: 92.1 fL (ref 78.0–100.0)
PLATELETS: 336 10*3/uL (ref 150–400)
RBC: 4.31 MIL/uL (ref 3.87–5.11)
RDW: 14.7 % (ref 11.5–15.5)
WBC: 10.3 10*3/uL (ref 4.0–10.5)

## 2017-04-13 LAB — COMPREHENSIVE METABOLIC PANEL
ALT: 9 U/L — AB (ref 14–54)
AST: 16 U/L (ref 15–41)
Albumin: 3.8 g/dL (ref 3.5–5.0)
Alkaline Phosphatase: 52 U/L (ref 38–126)
Anion gap: 8 (ref 5–15)
BUN: 10 mg/dL (ref 6–20)
CHLORIDE: 103 mmol/L (ref 101–111)
CO2: 27 mmol/L (ref 22–32)
CREATININE: 0.76 mg/dL (ref 0.44–1.00)
Calcium: 8.9 mg/dL (ref 8.9–10.3)
GFR calc Af Amer: >60 mL/min (ref >60)
Glucose, Bld: 98 mg/dL (ref 65–99)
Potassium: 4.3 mmol/L (ref 3.5–5.1)
SODIUM: 138 mmol/L (ref 135–145)
Total Bilirubin: 0.3 mg/dL (ref 0.3–1.2)
Total Protein: 7 g/dL (ref 6.5–8.1)

## 2017-04-13 LAB — I-STAT BETA HCG BLOOD, ED (MC, WL, AP ONLY): I-stat hCG, quantitative: <5 m[IU]/mL (ref <5)

## 2017-04-13 LAB — LIPASE, BLOOD: LIPASE: 34 U/L (ref 11–51)

## 2017-04-13 NOTE — ED Notes (Signed)
Called pt in lobby no response x2. 

## 2017-04-13 NOTE — ED Notes (Signed)
Called Pt in lobby for vital recheck, no response x1. 

## 2017-04-13 NOTE — ED Triage Notes (Signed)
Patient c/o abdominal cramping and sharp pain since this AM. Patient also c/o non productive cough x 1-2 months. Patient denies any N/v/D.

## 2017-04-14 ENCOUNTER — Emergency Department (HOSPITAL_COMMUNITY)
Admission: EM | Admit: 2017-04-14 | Discharge: 2017-04-14 | Disposition: A | Discharge status: Home / Self Care | Payer: PRIVATE HEALTH INSURANCE | Attending: Emergency Medicine | Admitting: Emergency Medicine

## 2017-04-14 HISTORY — DX: Polyp of corpus uteri: N84.0

## 2017-04-19 ENCOUNTER — Ambulatory Visit (HOSPITAL_COMMUNITY)
Admission: EM | Admit: 2017-04-19 | Discharge: 2017-04-19 | Disposition: A | Discharge status: Home / Self Care | Payer: PRIVATE HEALTH INSURANCE | Attending: Family Medicine | Admitting: Family Medicine

## 2017-04-19 ENCOUNTER — Encounter (HOSPITAL_COMMUNITY): Payer: Self-pay | Admitting: Emergency Medicine

## 2017-04-19 ENCOUNTER — Other Ambulatory Visit: Payer: Self-pay

## 2017-04-19 DIAGNOSIS — J0140 Acute pansinusitis, unspecified: Secondary | ICD-10-CM | POA: Diagnosis not present

## 2017-04-19 MED ORDER — IPRATROPIUM BROMIDE 0.06 % NA SOLN: 2.0000 | Freq: Four times a day (QID) | NASAL | 0 refills | Status: DC

## 2017-04-19 MED ORDER — DOXYCYCLINE HYCLATE 100 MG PO CAPS: 100.0000 mg | ORAL_CAPSULE | Freq: Two times a day (BID) | ORAL | 0 refills | Status: DC

## 2017-04-19 MED ORDER — FLUTICASONE PROPIONATE 50 MCG/ACT NA SUSP: 2.0000 | Freq: Every day | NASAL | 0 refills | Status: DC

## 2017-04-19 MED ORDER — ALBUTEROL SULFATE HFA 108 (90 BASE) MCG/ACT IN AERS: 1.0000 | INHALATION_SPRAY | Freq: Four times a day (QID) | RESPIRATORY_TRACT | 0 refills | Status: DC | PRN

## 2017-04-19 MED ORDER — PREDNISONE 20 MG PO TABS: 40.0000 mg | ORAL_TABLET | Freq: Every day | ORAL | 0 refills | Status: AC

## 2017-04-19 NOTE — ED Provider Notes (Signed)
[facility]    CSN: [id number] Arrival date & time: 04/19/17  1451     History   Chief Complaint Chief Complaint  Patient presents with  . Cough    HPI Paula Hawkins is a 22 y.o. female.   22 year old female comes in for 1-2 month history of URI symptoms.  She has had nonproductive cough, rhinorrhea, nasal congestion.  Has had wheezing at night with snoring.  States she has difficulty breathing, partially due to nasal congestion and unable to breathe through her nose, partially  from wheezing.  Denies fever, chills, night sweats.  She has a generalized headache that is aching/pressure feeling and can sometimes be sharp.  She denies current photophobia, phonophobia, nausea.  OTC cough drops and cold medication without relief.  Current every day smoker, 1.5 black in miles a day, 3-year history. Denies personal history of asthma. Family history of asthma, including twin sister, other siblings, both parents.       Past Medical History:  Diagnosis Date  . Abnormal uterine bleeding unrelated to menstrual cycle 08/25/2015  . Chlamydia   . Migraine    migraines  . Uterine polyp     Patient Active Problem List   Diagnosis Date Noted  . Abnormal uterine bleeding unrelated to menstrual cycle 08/25/2015    Past Surgical History:  Procedure Laterality Date  . HYSTEROSCOPY W/D&C N/A 11/23/2016   Procedure: DILATATION AND CURETTAGE /HYSTEROSCOPY;  Surgeon: Catalina Antigua, MD;  [facility];  Service: Gynecology;  Laterality: N/A;  . TONSILLECTOMY      OB History    Gravida Para Term Preterm AB Living   0 0 0 0 0 0   SAB TAB Ectopic Multiple Live Births   0 0 0 0         Home Medications    Prior to Admission medications   Medication Sig Start Date End Date Taking? Authorizing Provider  albuterol (PROVENTIL HFA;VENTOLIN HFA) 108 (90 Base) MCG/ACT inhaler Inhale 1-2 puffs into the lungs every 6 (six) hours as needed for wheezing or  shortness of breath. 04/19/17   Cathie Hoops, Neyra Pettie V, PA-C  doxycycline (VIBRAMYCIN) 100 MG capsule Take 1 capsule (100 mg total) by mouth 2 (two) times daily. 04/19/17   Cathie Hoops, Tyreck Bell V, PA-C  fluticasone (FLONASE) 50 MCG/ACT nasal spray Place 2 sprays into both nostrils daily. 04/19/17   Cathie Hoops, Kadan Millstein V, PA-C  ipratropium (ATROVENT) 0.06 % nasal spray Place 2 sprays into both nostrils 4 (four) times daily. 04/19/17   Cathie Hoops, Deven Audi V, PA-C  predniSONE (DELTASONE) 20 MG tablet Take 2 tablets (40 mg total) by mouth daily for 4 days. 04/19/17 04/23/17  Belinda Fisher, PA-C    Family History Family History  Problem Relation Age of Onset  . Diabetes Other   . Asthma Other   . Cancer Other   . Asthma Father   . Diabetes Mother   . Asthma Mother   . Hypertension Neg Hx     Social History Social History   Tobacco Use  . Smoking status: Current Some Day Smoker    Types: Cigars  . Smokeless tobacco: Never Used  Substance Use Topics  . Alcohol use: Yes    Comment: occasionally  . Drug use: No     Allergies   Patient has no known allergies.   Review of Systems Review of Systems  Reason unable to perform ROS: See HPI as above.     Physical Exam Triage  Vital Signs ED Triage Vitals  Enc Vitals Group     BP 04/19/17 1624 133/85     Pulse Rate 04/19/17 1624 80     Resp 04/19/17 1624 18     Temp 04/19/17 1624 98.3 F (36.8 C)     Temp Source 04/19/17 1624 Oral     SpO2 04/19/17 1624 100 %     Weight --      Height --      Head Circumference --      Peak Flow --      Pain Score 04/19/17 1622 5     Pain Loc --      Pain Edu? --      Excl. in GC? --    No data found.  Updated Vital Signs BP 133/85 (BP Location: Right Arm)   Pulse 80   Temp 98.3 F (36.8 C) (Oral)   Resp 18   LMP 04/13/2017   SpO2 100%   Physical Exam  Constitutional: She is oriented to person, place, and time. She appears well-developed and well-nourished. No distress.  HENT:  Head: Normocephalic and atraumatic.  Right Ear:  Tympanic membrane, external ear and ear canal normal. Tympanic membrane is not erythematous and not bulging.  Left Ear: Tympanic membrane, external ear and ear canal normal. Tympanic membrane is not erythematous and not bulging.  Nose: Mucosal edema and rhinorrhea present. Right sinus exhibits frontal sinus tenderness. Right sinus exhibits no maxillary sinus tenderness. Left sinus exhibits frontal sinus tenderness. Left sinus exhibits no maxillary sinus tenderness.  Mouth/Throat: Uvula is midline, oropharynx is clear and moist and mucous membranes are normal.  Eyes: Conjunctivae and EOM are normal. Pupils are equal, round, and reactive to light.  Neck: Normal range of motion. Neck supple.  Cardiovascular: Normal rate, regular rhythm and normal heart sounds. Exam reveals no gallop and no friction rub.  No murmur heard. Pulmonary/Chest: Effort normal and breath sounds normal. She has no decreased breath sounds. She has no wheezes. She has no rhonchi. She has no rales.  Lymphadenopathy:    She has no cervical adenopathy.  Neurological: She is alert and oriented to person, place, and time.  Skin: Skin is warm and dry.  Psychiatric: She has a normal mood and affect. Her behavior is normal. Judgment normal.     UC Treatments / Results  Labs (all labs ordered are listed, but only abnormal results are displayed) Labs Reviewed - No data to display  EKG  EKG Interpretation None       Radiology No results found.  Procedures Procedures (including critical care time)  Medications Ordered in UC Medications - No data to display   Initial Impression / Assessment and Plan / UC Course  I have reviewed the triage vital signs and the nursing notes.  Pertinent labs & imaging results that were available during my care of the patient were reviewed by me and considered in my medical decision making (see chart for details).    Doxycycline and prednisone for sinusitis and possible bronchitis.   Patient's lungs clear to auscultation bilaterally without adventitious lung sounds.  However, significant family history of asthma, with audible wheezing at nighttime.  Discussed this could be due to nasal congestion, but will provide albuterol inhaler for shortness of breath and wheezing at night to see if it helps with the symptoms.  Other symptomatic treatment discussed.  Push fluids.  Return precautions given.  Patient expresses understanding and agrees to plan.  Final Clinical Impressions(s) /  UC Diagnoses   Final diagnoses:  Acute non-recurrent pansinusitis    ED Discharge Orders        Ordered    doxycycline (VIBRAMYCIN) 100 MG capsule  2 times daily     04/19/17 1722    predniSONE (DELTASONE) 20 MG tablet  Daily     04/19/17 1722    albuterol (PROVENTIL HFA;VENTOLIN HFA) 108 (90 Base) MCG/ACT inhaler  Every 6 hours PRN     04/19/17 1722    fluticasone (FLONASE) 50 MCG/ACT nasal spray  Daily     04/19/17 1722    ipratropium (ATROVENT) 0.06 % nasal spray  4 times daily     04/19/17 1722        Belinda FisherYu, Sojourner Behringer V, PA-C 04/19/17 1728

## 2017-04-19 NOTE — Discharge Instructions (Signed)
Doxycycline and prednisone for sinus infection.  This will also help with the cough.  I have written you an albuterol inhaler, there is no wheezing on your exam today, but you can use for shortness of breath or wheezing at night to see if it helps with your symptoms.  Flonase and Atrovent nasal spray for nasal congestion/drainage. Keep hydrated, your urine should be clear to pale yellow in color. Tylenol/motrin for fever and pain. Monitor for any worsening of symptoms, chest pain, shortness of breath, wheezing, swelling of the throat, follow up for reevaluation.

## 2017-04-19 NOTE — ED Triage Notes (Signed)
Cough for 1-2 months.  Non-productive cough.  Reports wheezing and difficulty breathing.

## 2017-05-28 ENCOUNTER — Encounter (HOSPITAL_COMMUNITY): Payer: Self-pay | Admitting: Family Medicine

## 2017-05-28 ENCOUNTER — Ambulatory Visit (HOSPITAL_COMMUNITY)
Admission: EM | Admit: 2017-05-28 | Discharge: 2017-05-28 | Disposition: A | Discharge status: Home / Self Care | Payer: No Typology Code available for payment source | Attending: Internal Medicine | Admitting: Internal Medicine

## 2017-05-28 DIAGNOSIS — J4520 Mild intermittent asthma, uncomplicated: Secondary | ICD-10-CM

## 2017-05-28 DIAGNOSIS — M222X2 Patellofemoral disorders, left knee: Secondary | ICD-10-CM

## 2017-05-28 DIAGNOSIS — G43119 Migraine with aura, intractable, without status migrainosus: Secondary | ICD-10-CM | POA: Diagnosis not present

## 2017-05-28 DIAGNOSIS — G43109 Migraine with aura, not intractable, without status migrainosus: Secondary | ICD-10-CM

## 2017-05-28 MED ORDER — DEXAMETHASONE SODIUM PHOSPHATE 10 MG/ML IJ SOLN
INTRAMUSCULAR | Status: AC
  Filled 2017-05-28: qty 1

## 2017-05-28 MED ORDER — NAPROXEN 375 MG PO TABS: 375.0000 mg | ORAL_TABLET | Freq: Two times a day (BID) | ORAL | 0 refills | Status: DC

## 2017-05-28 MED ORDER — METOCLOPRAMIDE HCL 5 MG/ML IJ SOLN
INTRAMUSCULAR | Status: AC
  Filled 2017-05-28: qty 2

## 2017-05-28 MED ORDER — ALBUTEROL SULFATE HFA 108 (90 BASE) MCG/ACT IN AERS: 1.0000 | INHALATION_SPRAY | Freq: Four times a day (QID) | RESPIRATORY_TRACT | 0 refills | Status: DC | PRN

## 2017-05-28 MED ORDER — METOCLOPRAMIDE HCL 5 MG/ML IJ SOLN
5.0000 mg | Freq: Once | INTRAMUSCULAR | Status: AC
  Administered 2017-05-28: 5 mg via INTRAMUSCULAR

## 2017-05-28 MED ORDER — DEXAMETHASONE SODIUM PHOSPHATE 10 MG/ML IJ SOLN
10.0000 mg | Freq: Once | INTRAMUSCULAR | Status: AC
  Administered 2017-05-28: 10 mg via INTRAMUSCULAR

## 2017-05-28 MED ORDER — KETOROLAC TROMETHAMINE 60 MG/2ML IM SOLN
INTRAMUSCULAR | Status: AC
  Filled 2017-05-28: qty 2

## 2017-05-28 MED ORDER — KETOROLAC TROMETHAMINE 60 MG/2ML IM SOLN
60.0000 mg | Freq: Once | INTRAMUSCULAR | Status: AC
  Administered 2017-05-28: 60 mg via INTRAMUSCULAR

## 2017-05-28 NOTE — Discharge Instructions (Addendum)
Rest, ice, compress, and elevate leg Ace wrap given Perform quarter squats, elliptical, and/or recombinant bike to strengthen muscles that support the knee Naproxen prescribed take as needed for symptomatic relief of swelling and pain Follow up with PCP if symptoms persists  Given toradol, metoclopramide and dexamethasone injection in office for migraine relief Follow up with PCP for further evaluation and management  Albuterol inhaler prescribed for wheezing and shortness of breath.  Asymptomatic in office today, just request another prescription.  Return or go to the ER if you have any new or worsening symptoms

## 2017-05-28 NOTE — ED Triage Notes (Signed)
Pt here for migraine and left knee pain. sts the pain is worse with walking up stairs. denies injury.

## 2017-05-28 NOTE — ED Provider Notes (Signed)
[facility]    CSN: [id number] Arrival date & time: 05/28/17  1916     History   Chief Complaint Chief Complaint  Patient presents with  . Migraine  . Knee Pain    HPI Paula Hawkins is a 22 y.o. female.   Complains of knee pain for the past 2 days.  She denies a specific injury, but first noticed it after walking up stairs.  She localizes the pain to the anterior knee.  She describes the pain as intermittent and sharp.  She has tried OTC medication without relief.  Her symptoms are made worse with climbing stairs.  She denies hx of previous injury or previous symptoms. She reports minor swelling, and instability.  She denies erythema, ecchymosis, popping, or grinding.    Patient also complains of migraine that began yesterday.  Her pain is diffuse.  She describes the pain as constant and as a dull/ ache in character.  She has not tried taking any OTC medications.  Her symptoms are made worse with light and noise.  She reports hx of chronic migraines beginning since middle school in the past, but symptoms usually resolve with time.  She does not describe this as the worst headache of her life.  She reports nausea, aura, watery eyes, and rhinorrhea.  She denies fever, chills, nausea, vomiting, chest pain, SOB, abdominal pain, weakness, numbness or tingling.       Past Medical History:  Diagnosis Date  . Abnormal uterine bleeding unrelated to menstrual cycle 08/25/2015  . Chlamydia   . Migraine    migraines  . Uterine polyp     Patient Active Problem List   Diagnosis Date Noted  . Abnormal uterine bleeding unrelated to menstrual cycle 08/25/2015    Past Surgical History:  Procedure Laterality Date  . HYSTEROSCOPY W/D&C N/A 11/23/2016   Procedure: DILATATION AND CURETTAGE /HYSTEROSCOPY;  Surgeon: Catalina Antigua, MD;  Location: [facility];  Service: Gynecology;  Laterality: N/A;  . TONSILLECTOMY      OB History    Gravida  0   Para  0   Term   0   Preterm  0   AB  0   Living  0     SAB  0   TAB  0   Ectopic  0   Multiple  0   Live Births               Home Medications    Prior to Admission medications   Medication Sig Start Date End Date Taking? Authorizing Provider  albuterol (PROVENTIL HFA;VENTOLIN HFA) 108 (90 Base) MCG/ACT inhaler Inhale 1-2 puffs into the lungs every 6 (six) hours as needed for wheezing or shortness of breath. 05/28/17   Micaiah Remillard, [place], PA-C  doxycycline (VIBRAMYCIN) 100 MG capsule Take 1 capsule (100 mg total) by mouth 2 (two) times daily. 04/19/17   Cathie Hoops, Amy V, PA-C  fluticasone (FLONASE) 50 MCG/ACT nasal spray Place 2 sprays into both nostrils daily. 04/19/17   Cathie Hoops, Amy V, PA-C  ipratropium (ATROVENT) 0.06 % nasal spray Place 2 sprays into both nostrils 4 (four) times daily. 04/19/17   Cathie Hoops, Amy V, PA-C  naproxen (NAPROSYN) 375 MG tablet Take 1 tablet (375 mg total) by mouth 2 (two) times daily. 05/28/17   Rennis Harding, PA-C    Family History Family History  Problem Relation Age of Onset  . Diabetes Other   . Asthma Other   . Cancer Other   .  Asthma Father   . Diabetes Mother   . Asthma Mother   . Hypertension Neg Hx     Social History Social History   Tobacco Use  . Smoking status: Current Some Day Smoker    Types: Cigars  . Smokeless tobacco: Never Used  Substance Use Topics  . Alcohol use: Yes    Comment: occasionally  . Drug use: No     Allergies   Patient has no known allergies.   Review of Systems Review of Systems  Constitutional: Negative for chills, fatigue and fever.  HENT: Positive for rhinorrhea, sinus pressure and sinus pain.   Eyes: Positive for photophobia.  Respiratory: Negative for shortness of breath.   Cardiovascular: Negative for chest pain.  Gastrointestinal: Positive for nausea. Negative for constipation, diarrhea and vomiting.  Neurological: Positive for headaches. Negative for weakness and numbness.     Physical Exam Triage Vital  Signs ED Triage Vitals [05/28/17 2028]  Enc Vitals Group     BP 129/71     Pulse Rate (!) 102     Resp 18     Temp 98.8 F (37.1 C)     Temp Source Oral     SpO2 100 %     Weight      Height      Head Circumference      Peak Flow      Pain Score      Pain Loc      Pain Edu?      Excl. in GC?    No data found.  Updated Vital Signs BP 129/71   Pulse (!) 102   Temp 98.8 F (37.1 C) (Oral)   Resp 18   SpO2 100%    Physical Exam  Constitutional: She is oriented to person, place, and time. She appears well-developed and well-nourished. No distress.  HENT:  Head: Normocephalic and atraumatic.  Right Ear: External ear normal.  Left Ear: External ear normal.  Nose: Nose normal.  Mouth/Throat: Oropharynx is clear and moist. No oropharyngeal exudate.  Eyes: Pupils are equal, round, and reactive to light. EOM are normal. No scleral icterus.  Neck: Normal range of motion. Neck supple.  Cardiovascular: Normal rate, regular rhythm and normal heart sounds. Exam reveals no friction rub.  No murmur heard. Radial pulse 2+ bilaterally    Pulmonary/Chest: Effort normal and breath sounds normal. No stridor. No respiratory distress. She has no wheezes. She has no rales.  Musculoskeletal:  Left knee Inspection: Skin clear and intact with obvious erythema, effusion, or ecchymosis.  Skin warm and dry to the touch Palpation: Tender to palpation over the anterior patella and patellar tendon. No tenderness with valgus or varus stress.  ROM: FROM active and passive Strength: 5/5 hip flexion, 5/5 knee abduction, 5/5 knee adduction, 5/5 knee flexion, 5/5 knee extension, 5/5 dorsiflexion, 5/5 plantar flexion Stability: Negative anterior and posterior drawer.    Lymphadenopathy:    She has no cervical adenopathy.  Neurological: She is alert and oriented to person, place, and time. She has normal strength. No cranial nerve deficit or sensory deficit.  Reflex Scores:      Patellar reflexes are  2+ on the right side and 2+ on the left side. Sensation and strength intact about the upper and lower extremities.    Skin: Skin is warm and dry. Capillary refill takes less than 2 seconds. She is not diaphoretic.  Psychiatric: She has a normal mood and affect. Her behavior is normal. Judgment and thought  content normal.  Vitals reviewed.    UC Treatments / Results  Labs (all labs ordered are listed, but only abnormal results are displayed) Labs Reviewed - No data to display  EKG None Radiology No results found.  Procedures Procedures (including critical care time)  Medications Ordered in UC Medications  ketorolac (TORADOL) injection 60 mg (60 mg Intramuscular Given 05/28/17 2124)  metoCLOPramide (REGLAN) injection 5 mg (5 mg Intramuscular Given 05/28/17 2121)  dexamethasone (DECADRON) injection 10 mg (10 mg Intramuscular Given 05/28/17 2123)     Initial Impression / Assessment and Plan / UC Course  I have reviewed the triage vital signs and the nursing notes.  Pertinent labs & imaging results that were available during my care of the patient were reviewed by me and considered in my medical decision making (see chart for details).     Patient presents with left knee pain that began 2 days ago.  Patient's hx, symptoms, and PE consistent with PFS.  She was given a prescription for naproxen and instructed to rest, ice, compress with ace bandage, and elevate. The patient will also begin home exercises including quarter squats and cross training with elliptical and/or recombinant bike. She will follow up with PCP if symptoms persists.    Patient also complains of hx of chronic migraines.  Recent episode started yesterday with associated nausea, aura, photophobia, and phonophobia.  Patient stable without neurological deficits.  Denies worst headache of her life.  Given shot of toradol, metoclopramide, and decadron in office.  Will follow up with PCP for further evaluation and management.     Patient also has a hx of asthma and was seen recently for asthma complaint.  She was given a prescription for albuterol inhaler at that time, but never had it filled due to cost.  Patient is currently asymptomatic, and requests another prescription.    Return and ER precautions given.   Final Clinical Impressions(s) / UC Diagnoses   Final diagnoses:  Patellofemoral syndrome of left knee  Migraine with aura and without status migrainosus, not intractable  Mild intermittent asthma without complication    ED Discharge Orders        Ordered    naproxen (NAPROSYN) 375 MG tablet  2 times daily     05/28/17 2123    albuterol (PROVENTIL HFA;VENTOLIN HFA) 108 (90 Base) MCG/ACT inhaler  Every 6 hours PRN     05/28/17 2123       Controlled Substance Prescriptions  Controlled Substance Registry consulted? No   Rennis Harding, [place] 05/28/17 2139

## 2017-09-04 ENCOUNTER — Encounter (HOSPITAL_COMMUNITY): Payer: Self-pay | Admitting: Emergency Medicine

## 2017-09-04 ENCOUNTER — Ambulatory Visit (HOSPITAL_COMMUNITY)
Admission: EM | Admit: 2017-09-04 | Discharge: 2017-09-04 | Disposition: A | Discharge status: Home / Self Care | Payer: Self-pay | Attending: Urgent Care | Admitting: Urgent Care

## 2017-09-04 DIAGNOSIS — R457 State of emotional shock and stress, unspecified: Secondary | ICD-10-CM

## 2017-09-04 DIAGNOSIS — F4541 Pain disorder exclusively related to psychological factors: Secondary | ICD-10-CM

## 2017-09-04 DIAGNOSIS — G43809 Other migraine, not intractable, without status migrainosus: Secondary | ICD-10-CM

## 2017-09-04 MED ORDER — MELOXICAM 7.5 MG PO TABS: 7.5000 mg | ORAL_TABLET | Freq: Every day | ORAL | 0 refills | Status: DC

## 2017-09-04 MED ORDER — CYCLOBENZAPRINE HCL 5 MG PO TABS: 5.0000 mg | ORAL_TABLET | Freq: Every evening | ORAL | 0 refills | Status: DC | PRN

## 2017-09-04 NOTE — ED Provider Notes (Signed)
  MRN: [id number] DOB: 1996-01-15  Subjective:   Paula Hawkins is a 22 y.o. female presenting for 3 day history of persistent migraine. It is frontal, throbbing/pounding, intermittent photosensitivity, subjective fever. Denies sinus congestion, sinus pain, ear pain, cough, sore throat. She is not currently taking any medications.  Has not tried any medications for relief. Has typically slept off her migraines. Does not have a PCP. She does not hydrate well, drinks a lot tea. Drinks wine once monthly. Denies smoking cigarettes. Smokes Black & Mild daily. Sleeps ~4-6 hours per night. Works 12 hour shifts, does not have a good living situation. Her roommate is awake while patient is supposed to sleep. Has a lot of familial stressors, helps her mother who has history of a stroke.   No Known Allergies  Past Medical History:  Diagnosis Date  . Abnormal uterine bleeding unrelated to menstrual cycle 08/25/2015  . Chlamydia   . Migraine    migraines  . Uterine polyp      Past Surgical History:  Procedure Laterality Date  . HYSTEROSCOPY W/D&C N/A 11/23/2016   Procedure: DILATATION AND CURETTAGE /HYSTEROSCOPY;  Surgeon: Catalina Antiguaonstant, Peggy, MD;  Location: [facility];  Service: Gynecology;  Laterality: N/A;  . TONSILLECTOMY      Objective:   Vitals: BP 132/79   Pulse 94   Temp 98.6 F (37 C) (Oral)   Resp 16   LMP 08/25/2017   SpO2 100%   Physical Exam  Constitutional: She is oriented to person, place, and time. She appears well-developed and well-nourished.  HENT:  Mouth/Throat: Oropharynx is clear and moist.  Eyes: Pupils are equal, round, and reactive to light. EOM are normal. Right eye exhibits no discharge. Left eye exhibits no discharge. No scleral icterus.  Cardiovascular: Normal rate.  Pulmonary/Chest: Effort normal.  Neurological: She is alert and oriented to person, place, and time. No cranial nerve deficit. Coordination normal.  Skin: Skin is warm and dry.   Psychiatric:  Flat affect but can be cheerful.   Assessment and Plan :   Other migraine without status migrainosus, not intractable  Stress headache  Emotional stress  Counseled patient on management of headaches.  This appears to be more of a tension type headache related to all of her stressors.  She will reach out to a therapist and in the meantime we will try lifestyle modifications.  Use meloxicam for headaches, Flexeril for neck tension.  Return to clinic precautions reviewed.     Wallis BambergMani, Mikella Linsley, [place]PA-C 09/04/17 [phone]

## 2017-09-04 NOTE — ED Triage Notes (Signed)
Pt reports migraine for 3 days. Hasn't taken any meds for this migraine because per PT her meds never work. Would like a referral to neuro

## 2017-09-04 NOTE — Discharge Instructions (Addendum)
You Are a Badass: How to Stop Doubting Your Greatness and Start Living an Awesome Life  Book by Delta Air LinesJen Sincero   Hydrate well with at least 2 liters (1 gallon) of water daily. Try to sleep 7-8 hours per night.    [facility] ([address]Beth Hulan AmatoKincaid, Ernest FlaglerMcCoy, Heather Joycelyn SchmidKitchens, Karla Spring [place]) - [phone]  [facility](Julie ConwayWhitt) - [phone]  [facility] - [phone]  [facility] - [phone]  [facility] Irish[facility] - [phone]  [facility]  - [phone] (do not file insurance)

## 2017-10-15 ENCOUNTER — Other Ambulatory Visit: Payer: Self-pay

## 2017-10-15 ENCOUNTER — Emergency Department (HOSPITAL_COMMUNITY)
Admission: EM | Admit: 2017-10-15 | Discharge: 2017-10-16 | Disposition: A | Discharge status: Home / Self Care | Payer: No Typology Code available for payment source | Attending: Emergency Medicine | Admitting: Emergency Medicine

## 2017-10-15 ENCOUNTER — Encounter (HOSPITAL_COMMUNITY): Payer: Self-pay | Admitting: *Deleted

## 2017-10-15 ENCOUNTER — Emergency Department (HOSPITAL_COMMUNITY): Payer: No Typology Code available for payment source

## 2017-10-15 DIAGNOSIS — F17200 Nicotine dependence, unspecified, uncomplicated: Secondary | ICD-10-CM | POA: Insufficient documentation

## 2017-10-15 DIAGNOSIS — Z79899 Other long term (current) drug therapy: Secondary | ICD-10-CM | POA: Insufficient documentation

## 2017-10-15 DIAGNOSIS — R0602 Shortness of breath: Secondary | ICD-10-CM | POA: Insufficient documentation

## 2017-10-15 DIAGNOSIS — J45909 Unspecified asthma, uncomplicated: Secondary | ICD-10-CM | POA: Insufficient documentation

## 2017-10-15 HISTORY — DX: Unspecified asthma, uncomplicated: J45.909

## 2017-10-15 MED ORDER — IPRATROPIUM BROMIDE 0.02 % IN SOLN
0.5000 mg | Freq: Once | RESPIRATORY_TRACT | Status: AC
  Administered 2017-10-15: 0.5 mg via RESPIRATORY_TRACT
  Filled 2017-10-15: qty 2.5

## 2017-10-15 MED ORDER — ALBUTEROL SULFATE (2.5 MG/3ML) 0.083% IN NEBU
5.0000 mg | INHALATION_SOLUTION | Freq: Once | RESPIRATORY_TRACT | Status: AC
  Administered 2017-10-15: 5 mg via RESPIRATORY_TRACT
  Filled 2017-10-15: qty 6

## 2017-10-15 NOTE — ED Provider Notes (Signed)
[facility]-EMERGENCY DEPT Provider Note   CSN: [id number] Arrival date & time: 10/15/17  2012     History   Chief Complaint Chief Complaint  Patient presents with  . Cough    HPI Paula Hawkins is a 22 y.o. female.  The history is provided by the patient and medical records.  Cough  Associated symptoms include shortness of breath.     22 year old female with history of migraine headaches, newly diagnosed asthma, presenting to the ED for cough and shortness of breath.  States she was just diagnosed with asthma few weeks ago from a physician in urgent care.  She was prescribed some inhalers but has not been able to afford them.  States she has some chest tightness and feels that her lungs are "not expanding".  States she has an intermittently productive cough but no fever or chills.  She has not had any sick contacts.  No new pets but states that how she lives and currently is very dusty.  She has known history of environmental allergies.  She has not had any medications for her symptoms prior to arrival.  Past Medical History:  Diagnosis Date  . Abnormal uterine bleeding unrelated to menstrual cycle 08/25/2015  . Asthma   . Chlamydia   . Migraine    migraines  . Uterine polyp     Patient Active Problem List   Diagnosis Date Noted  . Abnormal uterine bleeding unrelated to menstrual cycle 08/25/2015    Past Surgical History:  Procedure Laterality Date  . HYSTEROSCOPY W/D&C N/A 11/23/2016   Procedure: DILATATION AND CURETTAGE /HYSTEROSCOPY;  Surgeon: Catalina Antiguaonstant, Peggy, MD;  Location: [facility];  Service: Gynecology;  Laterality: N/A;  . TONSILLECTOMY       OB History    Gravida  0   Para  0   Term  0   Preterm  0   AB  0   Living  0     SAB  0   TAB  0   Ectopic  0   Multiple  0   Live Births               Home Medications    Prior to Admission medications   Medication Sig Start Date End Date Taking?  Authorizing Provider  cyclobenzaprine (FLEXERIL) 5 MG tablet Take 1 tablet (5 mg total) by mouth at bedtime as needed for muscle spasms. 09/04/17   Wallis BambergMani, Mario, PA-C  meloxicam (MOBIC) 7.5 MG tablet Take 1 tablet (7.5 mg total) by mouth daily. 09/04/17   Wallis BambergMani, Mario, PA-C    Family History Family History  Problem Relation Age of Onset  . Diabetes Other   . Asthma Other   . Cancer Other   . Asthma Father   . Diabetes Mother   . Asthma Mother   . Hypertension Neg Hx     Social History Social History   Tobacco Use  . Smoking status: Current Some Day Smoker    Types: Cigars  . Smokeless tobacco: Never Used  Substance Use Topics  . Alcohol use: Yes    Comment: occasionally  . Drug use: No     Allergies   Patient has no known allergies.   Review of Systems Review of Systems  Respiratory: Positive for cough, chest tightness and shortness of breath.   All other systems reviewed and are negative.    Physical Exam Updated Vital Signs BP 131/83   Pulse 99   Temp  98.1 F (36.7 C) (Oral)   Resp 18   LMP 09/24/2017   SpO2 100%   Physical Exam  Constitutional: She is oriented to person, place, and time. She appears well-developed and well-nourished.  HENT:  Head: Normocephalic and atraumatic.  Right Ear: Tympanic membrane and ear canal normal.  Left Ear: Tympanic membrane and ear canal normal.  Nose: Nose normal.  Mouth/Throat: Uvula is midline, oropharynx is clear and moist and mucous membranes are normal.  Eyes: Pupils are equal, round, and reactive to light. Conjunctivae and EOM are normal.  Neck: Normal range of motion.  Cardiovascular: Normal rate, regular rhythm and normal heart sounds.  Pulmonary/Chest: Effort normal. She has decreased breath sounds. She has wheezes.  Diminished breath sounds, faint wheezes, no acute distress, speaking in full sentences without difficulty  Abdominal: Soft. Bowel sounds are normal.  Musculoskeletal: Normal range of motion.    Neurological: She is alert and oriented to person, place, and time.  Skin: Skin is warm and dry.  Psychiatric: She has a normal mood and affect.  Nursing note and vitals reviewed.    ED Treatments / Results  Labs (all labs ordered are listed, but only abnormal results are displayed) Labs Reviewed - No data to display  EKG None  Radiology No results found.  Procedures Procedures (including critical care time)  Medications Ordered in ED Medications  albuterol (PROVENTIL HFA;VENTOLIN HFA) 108 (90 Base) MCG/ACT inhaler 2 puff (has no administration in time range)  albuterol (PROVENTIL) (2.5 MG/3ML) 0.083% nebulizer solution 5 mg (5 mg Nebulization Given 10/15/17 2346)  ipratropium (ATROVENT) nebulizer solution 0.5 mg (0.5 mg Nebulization Given 10/15/17 2346)     Initial Impression / Assessment and Plan / ED Course  I have reviewed the triage vital signs and the nursing notes.  Pertinent labs & imaging results that were available during my care of the patient were reviewed by me and considered in my medical decision making (see chart for details).  22 year old female here with cough and shortness of breath.  Recently diagnosed with asthma at urgent care.  Was given prescriptions for inhalers but unable to afford them.  She is afebrile and nontoxic.  Lung sounds diminished but no acute distress.  Is able to speak in full sentences.  Will obtain chest x-ray and give neb treatment here.  Will reassess.  12:46 AM All symptoms resolved after neb treatment.  States she feels like she can breathe normally again.  Chest x-ray is clear.  Appears stable for d/c.  Given albuterol inhaler for home use since she is unable to afford prescription.  Will have her follow-up with her primary care doctor.  She will return here for any new or worsening symptoms.  Final Clinical Impressions(s) / ED Diagnoses   Final diagnoses:  Shortness of breath    ED Discharge Orders    None       Garlon Hatchet, PA-C 10/16/17 0102    Gilda Crease, MD 10/16/17 [phone]

## 2017-10-15 NOTE — ED Triage Notes (Signed)
Pt with cough and wheezing since waking up this morning. Hx of asthma, her inhaler is not working. No distress, breath sounds clear in triage.

## 2017-10-16 MED ORDER — ALBUTEROL SULFATE HFA 108 (90 BASE) MCG/ACT IN AERS
2.0000 | INHALATION_SPRAY | Freq: Once | RESPIRATORY_TRACT | Status: AC
  Administered 2017-10-16: 2 via RESPIRATORY_TRACT
  Filled 2017-10-16: qty 6.7

## 2017-10-16 NOTE — Discharge Instructions (Signed)
Use inhaler as needed-- 2 puffs every 4-6 hours as needed. Follow-up with your primary care doctor. Return to the ED for new or worsening symptoms.

## 2018-01-28 ENCOUNTER — Ambulatory Visit (HOSPITAL_COMMUNITY)
Admission: EM | Admit: 2018-01-28 | Discharge: 2018-01-28 | Disposition: A | Discharge status: Home / Self Care | Payer: PRIVATE HEALTH INSURANCE | Attending: Family Medicine | Admitting: Family Medicine

## 2018-01-28 ENCOUNTER — Other Ambulatory Visit: Payer: Self-pay

## 2018-01-28 ENCOUNTER — Encounter (HOSPITAL_COMMUNITY): Payer: Self-pay | Admitting: *Deleted

## 2018-01-28 DIAGNOSIS — J019 Acute sinusitis, unspecified: Secondary | ICD-10-CM | POA: Diagnosis not present

## 2018-01-28 MED ORDER — AMOXICILLIN-POT CLAVULANATE 875-125 MG PO TABS: 1.0000 | ORAL_TABLET | Freq: Two times a day (BID) | ORAL | 0 refills | Status: DC

## 2018-01-28 MED ORDER — FLUTICASONE PROPIONATE 50 MCG/ACT NA SUSP: 1.0000 | Freq: Every day | NASAL | 0 refills | Status: DC

## 2018-01-28 MED ORDER — ALBUTEROL SULFATE HFA 108 (90 BASE) MCG/ACT IN AERS: 1.0000 | INHALATION_SPRAY | Freq: Four times a day (QID) | RESPIRATORY_TRACT | 0 refills | Status: DC | PRN

## 2018-01-28 MED ORDER — CETIRIZINE HCL 10 MG PO CAPS: 10.0000 mg | ORAL_CAPSULE | Freq: Every day | ORAL | 0 refills | Status: DC

## 2018-01-28 MED ORDER — PREDNISONE 50 MG PO TABS: 50.0000 mg | ORAL_TABLET | Freq: Every day | ORAL | 0 refills | Status: AC

## 2018-01-28 NOTE — Discharge Instructions (Addendum)
I am treating you for a sinus infection Please begin Augmentin twice daily for the next week Prednisone daily for the next 5 days, please have food on your stomach while taking, this should help with your asthma as well Please begin daily cetirizine/Zyrtec to help with congestion and drainage Flonase nasal spray to help with nasal congestion and swelling in nose that may be contributing to your difficulty breathing at nighttime  For cough/sore throat try using a honey-based tea. Use 3 teaspoons of honey with juice squeezed from half lemon. Place shaved pieces of ginger into 1/2-1 cup of water and warm over stove top. Then mix the ingredients and repeat every 4 hours as needed.

## 2018-01-28 NOTE — ED Triage Notes (Signed)
C/O sinus pressure and ;congestion x approx 2 wks.

## 2018-01-29 NOTE — ED Provider Notes (Signed)
[facility]    CSN: [id number] Arrival date & time: 01/28/18  1753     History   Chief Complaint Chief Complaint  Patient presents with  . Facial Pain  . Nasal Congestion    HPI Paula Hawkins is a 22 y.o. female no contributing PMH, Patient is presenting with URI symptoms- congestion, cough, sore throat. Patient's main complaints are sinus pressure and headache, length of symptoms. Symptoms have been going on for 2 weeks. Patient has tried home remedies- states does not like medicines. Denies fever, nausea, vomiting, diarrhea. Denies shortness of breath and chest pain.   HPI  Past Medical History:  Diagnosis Date  . Abnormal uterine bleeding unrelated to menstrual cycle 08/25/2015  . Asthma   . Chlamydia   . Migraine    migraines  . Uterine polyp     Patient Active Problem List   Diagnosis Date Noted  . Abnormal uterine bleeding unrelated to menstrual cycle 08/25/2015    Past Surgical History:  Procedure Laterality Date  . HYSTEROSCOPY W/D&C N/A 11/23/2016   Procedure: DILATATION AND CURETTAGE /HYSTEROSCOPY;  Surgeon: Catalina Antiguaonstant, Peggy, MD;  Location: [facility];  Service: Gynecology;  Laterality: N/A;  . TONSILLECTOMY      OB History    Gravida  0   Para  0   Term  0   Preterm  0   AB  0   Living  0     SAB  0   TAB  0   Ectopic  0   Multiple  0   Live Births               Home Medications    Prior to Admission medications   Medication Sig Start Date End Date Taking? Authorizing Provider  albuterol (PROVENTIL HFA;VENTOLIN HFA) 108 (90 Base) MCG/ACT inhaler Inhale 1-2 puffs into the lungs every 6 (six) hours as needed for wheezing or shortness of breath. 01/28/18   Maleta Pacha C, PA-C  amoxicillin-clavulanate (AUGMENTIN) 875-125 MG tablet Take 1 tablet by mouth every 12 (twelve) hours. 01/28/18   Nathalie Cavendish C, PA-C  Cetirizine HCl 10 MG CAPS Take 1 capsule (10 mg total) by mouth daily for 10 days.  01/28/18 02/07/18  Erian Rosengren C, PA-C  cyclobenzaprine (FLEXERIL) 5 MG tablet Take 1 tablet (5 mg total) by mouth at bedtime as needed for muscle spasms. 09/04/17   Wallis BambergMani, Mario, PA-C  fluticasone (FLONASE) 50 MCG/ACT nasal spray Place 1-2 sprays into both nostrils daily for 7 days. 01/28/18 02/04/18  Petros Ahart C, PA-C  meloxicam (MOBIC) 7.5 MG tablet Take 1 tablet (7.5 mg total) by mouth daily. 09/04/17   Wallis BambergMani, Mario, PA-C  predniSONE (DELTASONE) 50 MG tablet Take 1 tablet (50 mg total) by mouth daily for 5 days. 01/28/18 02/02/18  Kailea Dannemiller, Junius CreamerHallie C, PA-C    Family History Family History  Problem Relation Age of Onset  . Diabetes Other   . Asthma Other   . Cancer Other   . Asthma Father   . Diabetes Mother   . Asthma Mother   . Hypertension Neg Hx     Social History Social History   Tobacco Use  . Smoking status: Current Some Day Smoker    Types: Cigars  . Smokeless tobacco: Never Used  Substance Use Topics  . Alcohol use: Yes    Comment: occasionally  . Drug use: No     Allergies   Patient has no known allergies.   Review  of Systems Review of Systems  Constitutional: Negative for activity change, appetite change, chills, fatigue and fever.  HENT: Positive for congestion, rhinorrhea, sinus pressure and sore throat. Negative for ear pain and trouble swallowing.   Eyes: Negative for discharge and redness.  Respiratory: Positive for cough. Negative for chest tightness and shortness of breath.   Cardiovascular: Negative for chest pain.  Gastrointestinal: Negative for abdominal pain, diarrhea, nausea and vomiting.  Musculoskeletal: Negative for myalgias.  Skin: Negative for rash.  Neurological: Negative for dizziness, light-headedness and headaches.     Physical Exam Triage Vital Signs ED Triage Vitals  Enc Vitals Group     BP 01/28/18 1848 124/63     Pulse Rate 01/28/18 1848 95     Resp 01/28/18 1848 16     Temp 01/28/18 1848 97.9 F (36.6 C)     Temp Source  01/28/18 1848 Oral     SpO2 01/28/18 1848 100 %     Weight --      Height --      Head Circumference --      Peak Flow --      Pain Score 01/28/18 1849 0     Pain Loc --      Pain Edu? --      Excl. in GC? --    No data found.  Updated Vital Signs BP 124/63   Pulse 95   Temp 97.9 F (36.6 C) (Oral)   Resp 16   LMP 01/12/2018 (Exact Date)   SpO2 100%   Visual Acuity Right Eye Distance:   Left Eye Distance:   Bilateral Distance:    Right Eye Near:   Left Eye Near:    Bilateral Near:     Physical Exam Vitals signs and nursing note reviewed.  Constitutional:      General: She is not in acute distress.    Appearance: She is well-developed.  HENT:     Head: Normocephalic and atraumatic.     Ears:     Comments: Bilateral ears without tenderness to palpation of external auricle, tragus and mastoid, EAC's without erythema or swelling, TM's with good bony landmarks and cone of light. Non erythematous.    Nose:     Comments: Swollen turbinates, erythematous mucosa    Mouth/Throat:     Comments: Oral mucosa pink and moist, no tonsillar enlargement or exudate. Posterior pharynx patent and nonerythematous, no uvula deviation or swelling. Normal phonation. Eyes:     Conjunctiva/sclera: Conjunctivae normal.  Neck:     Musculoskeletal: Neck supple.  Cardiovascular:     Rate and Rhythm: Normal rate and regular rhythm.     Heart sounds: No murmur.  Pulmonary:     Effort: Pulmonary effort is normal. No respiratory distress.     Breath sounds: Normal breath sounds.     Comments: Breathing comfortably at rest, CTABL, no wheezing, rales or other adventitious sounds auscultated Abdominal:     Palpations: Abdomen is soft.     Tenderness: There is no abdominal tenderness.  Skin:    General: Skin is warm and dry.  Neurological:     Mental Status: She is alert.      UC Treatments / Results  Labs (all labs ordered are listed, but only abnormal results are displayed) Labs  Reviewed - No data to display  EKG None  Radiology No results found.  Procedures Procedures (including critical care time)  Medications Ordered in UC Medications - No data to display  Initial Impression /  Assessment and Plan / UC Course  I have reviewed the triage vital signs and the nursing notes.  Pertinent labs & imaging results that were available during my care of the patient were reviewed by me and considered in my medical decision making (see chart for details).    URI symptoms x2 weeks, headache and for structural pressure that has worsened over the past 3 days.  Will treat for sinusitis with Augmentin and prednisone.  Discussed further symptomatic management of her congestion, drainage and cough.  Recommendations below.  Discussed further natural remedies that she may try, but if her symptoms have been going on for 2 weeks without improvement it may be worthwhile to try the medicines provided to her.Discussed strict return precautions. Patient verbalized understanding and is agreeable with plan.   Final Clinical Impressions(s) / UC Diagnoses   Final diagnoses:  Acute sinusitis with symptoms > 10 days     Discharge Instructions     I am treating you for a sinus infection Please begin Augmentin twice daily for the next week Prednisone daily for the next 5 days, please have food on your stomach while taking, this should help with your asthma as well Please begin daily cetirizine/Zyrtec to help with congestion and drainage Flonase nasal spray to help with nasal congestion and swelling in nose that may be contributing to your difficulty breathing at nighttime  For cough/sore throat try using a honey-based tea. Use 3 teaspoons of honey with juice squeezed from half lemon. Place shaved pieces of ginger into 1/2-1 cup of water and warm over stove top. Then mix the ingredients and repeat every 4 hours as needed.   ED Prescriptions    Medication Sig Dispense Auth. Provider    amoxicillin-clavulanate (AUGMENTIN) 875-125 MG tablet Take 1 tablet by mouth every 12 (twelve) hours. 14 tablet Sharonlee Nine C, PA-C   predniSONE (DELTASONE) 50 MG tablet Take 1 tablet (50 mg total) by mouth daily for 5 days. 5 tablet Mallorie Norrod C, PA-C   Cetirizine HCl 10 MG CAPS Take 1 capsule (10 mg total) by mouth daily for 10 days. 10 capsule Mahin Guardia C, PA-C   fluticasone (FLONASE) 50 MCG/ACT nasal spray Place 1-2 sprays into both nostrils daily for 7 days. 1 g Lendell Gallick C, PA-C   albuterol (PROVENTIL HFA;VENTOLIN HFA) 108 (90 Base) MCG/ACT inhaler Inhale 1-2 puffs into the lungs every 6 (six) hours as needed for wheezing or shortness of breath. 1 Inhaler Ibrohim Simmers C, PA-C     Controlled Substance Prescriptions [place] Controlled Substance Registry consulted? Not Applicable   Lew Dawes, [place] 01/29/18 2110

## 2018-02-11 ENCOUNTER — Other Ambulatory Visit: Payer: Self-pay

## 2018-02-11 ENCOUNTER — Encounter (HOSPITAL_COMMUNITY): Payer: Self-pay | Admitting: Emergency Medicine

## 2018-02-11 ENCOUNTER — Ambulatory Visit (HOSPITAL_COMMUNITY)
Admission: EM | Admit: 2018-02-11 | Discharge: 2018-02-11 | Disposition: A | Discharge status: Home / Self Care | Payer: PRIVATE HEALTH INSURANCE | Attending: Physician Assistant | Admitting: Physician Assistant

## 2018-02-11 DIAGNOSIS — J4521 Mild intermittent asthma with (acute) exacerbation: Secondary | ICD-10-CM | POA: Insufficient documentation

## 2018-02-11 DIAGNOSIS — F17200 Nicotine dependence, unspecified, uncomplicated: Secondary | ICD-10-CM | POA: Insufficient documentation

## 2018-02-11 MED ORDER — PREDNISONE 20 MG PO TABS: ORAL_TABLET | ORAL | 0 refills | Status: AC

## 2018-02-11 NOTE — ED Triage Notes (Signed)
[facility] the [facility] cough x 2 days.

## 2018-02-11 NOTE — ED Provider Notes (Signed)
02/11/2018 6:25 PM   DOB: 09-21-1995 / MRN: [id number]  SUBJECTIVE:  Paula Hawkins is a 23 y.o. female presenting for cough and nasal congestion.  Reports a history of asthma and is concerned about her breathing.  She says everything triggers her asthma.  After some questioning she admits to smoking 1-2 black in miles a day and she does inhale.  She does this mostly at work.  She has No Known Allergies.   She  has a past medical history of Abnormal uterine bleeding unrelated to menstrual cycle (08/25/2015), Asthma, Chlamydia, Migraine, and Uterine polyp.    She  reports that she has been smoking cigars. She has never used smokeless tobacco. She reports current alcohol use. She reports that she does not use drugs. She  has no history on file for sexual activity. The patient  has a past surgical history that includes Tonsillectomy and Hysteroscopy w/D&C (N/A, 11/23/2016).  Her family history includes Asthma in her father, mother, and another family member; Cancer in an other family member; Diabetes in her mother and another family member.  Review of Systems  Constitutional: Negative for diaphoresis.  Respiratory: Positive for cough and wheezing. Negative for hemoptysis, sputum production and shortness of breath.   Cardiovascular: Negative for chest pain, orthopnea and leg swelling.  Gastrointestinal: Negative for nausea.  Genitourinary: Negative for dysuria.  Neurological: Negative for dizziness.    OBJECTIVE:  BP 128/83 (BP Location: Left Arm)   Pulse (!) 106   Temp 98.9 F (37.2 C) (Oral)   Resp 16   LMP 02/11/2018   SpO2 97%   Wt Readings from Last 3 Encounters:  04/13/17 261 lb (118.4 kg)  12/08/16 271 lb (122.9 kg)  11/23/16 264 lb (119.7 kg)   Temp Readings from Last 3 Encounters:  02/11/18 98.9 F (37.2 C) (Oral)  01/28/18 97.9 F (36.6 C) (Oral)  10/15/17 98.1 F (36.7 C) (Oral)   BP Readings from Last 3 Encounters:  02/11/18 128/83  01/28/18 124/63  10/16/17  132/71   Pulse Readings from Last 3 Encounters:  02/11/18 (!) 106  01/28/18 95  10/16/17 93    Physical Exam Constitutional:      Appearance: She is not diaphoretic.  HENT:     Right Ear: External ear normal.     Left Ear: External ear normal.     Nose: Mucosal edema present.     Right Sinus: No maxillary sinus tenderness or frontal sinus tenderness.     Left Sinus: No maxillary sinus tenderness or frontal sinus tenderness.     Mouth/Throat:     Pharynx: No oropharyngeal exudate.  Eyes:     Conjunctiva/sclera: Conjunctivae normal.     Pupils: Pupils are equal, round, and reactive to light.  Cardiovascular:     Rate and Rhythm: Regular rhythm.     Heart sounds: Normal heart sounds.  Pulmonary:     Effort: Pulmonary effort is normal.     Breath sounds: Rhonchi present. No rales.  Skin:    General: Skin is warm and dry.     Findings: No erythema or rash.  Neurological:     Mental Status: She is alert and oriented to person, place, and time.  Psychiatric:        Behavior: Behavior normal.     No results found for this or any previous visit (from the past 72 hour(s)).  No results found.  ASSESSMENT AND PLAN:   Mild intermittent asthma with acute exacerbation: I have asked her  to quit smoking.  I made her aware that she will likely go on to develop COPD given she has a history of asthma.  She agrees to stop.  Given this most recent flare in her asthma I will start a prednisone burst.  Is that she can come back if she is having fever, chills, shortness of breath, chest pain.  Current every day smoker    Discharge Instructions     Please stop smoking.  Again give you some steroid medication to help you with your cough and to help you breathe easier.  It is a 5-day prednisone burst.        The patient is advised to call or return to clinic if she does not see an improvement in symptoms, or to seek the care of the closest emergency department if she worsens with the  above plan.   Deliah BostonMichael Ajanee Buren, MHS, PA-C 02/11/2018 6:25 PM   Ofilia Neaslark, Novalynn Branaman L, PA-C 02/11/18 1826

## 2018-02-11 NOTE — Discharge Instructions (Signed)
Please stop smoking.  Again give you some steroid medication to help you with your cough and to help you breathe easier.  It is a 5-day prednisone burst.

## 2018-02-25 ENCOUNTER — Encounter (HOSPITAL_COMMUNITY): Payer: Self-pay | Admitting: Emergency Medicine

## 2018-02-25 ENCOUNTER — Ambulatory Visit (HOSPITAL_COMMUNITY)
Admission: EM | Admit: 2018-02-25 | Discharge: 2018-02-25 | Disposition: A | Discharge status: Home / Self Care | Payer: Self-pay | Attending: Internal Medicine | Admitting: Internal Medicine

## 2018-02-25 ENCOUNTER — Other Ambulatory Visit: Payer: Self-pay

## 2018-02-25 DIAGNOSIS — G44029 Chronic cluster headache, not intractable: Secondary | ICD-10-CM | POA: Insufficient documentation

## 2018-02-25 MED ORDER — ONDANSETRON 4 MG PO TBDP
ORAL_TABLET | ORAL | Status: AC
  Filled 2018-02-25: qty 1

## 2018-02-25 MED ORDER — KETOROLAC TROMETHAMINE 60 MG/2ML IM SOLN
INTRAMUSCULAR | Status: AC
  Filled 2018-02-25: qty 2

## 2018-02-25 MED ORDER — KETOROLAC TROMETHAMINE 60 MG/2ML IM SOLN
60.0000 mg | Freq: Once | INTRAMUSCULAR | Status: AC
  Administered 2018-02-25: 60 mg via INTRAMUSCULAR

## 2018-02-25 MED ORDER — ONDANSETRON 4 MG PO TBDP
4.0000 mg | ORAL_TABLET | Freq: Once | ORAL | Status: AC
  Administered 2018-02-25: 4 mg via ORAL

## 2018-02-25 MED ORDER — ONDANSETRON HCL 4 MG PO TABS: 4.0000 mg | ORAL_TABLET | Freq: Three times a day (TID) | ORAL | 0 refills | Status: DC | PRN

## 2018-02-25 MED ORDER — CYCLOBENZAPRINE HCL 10 MG PO TABS: 10.0000 mg | ORAL_TABLET | Freq: Two times a day (BID) | ORAL | 0 refills | Status: DC | PRN

## 2018-02-25 NOTE — ED Provider Notes (Signed)
[facility]    CSN: [id number] Arrival date & time: 02/25/18  1650     History   Chief Complaint Chief Complaint  Patient presents with  . Migraine    HPI Paula Hawkins is a 23 y.o. female.   Onset of recurrent migraine x 4 days and felt worse when she woke up this am was around L orbit, went back to sleep( lightly) and work up with it on R orbit. Has tried Aleeve last night and this morning and has not helped. Has been nauseous and has photophobia and sonophobia. Get them at night time. Has never tried Excedrin.  Has not vomited. She gets average 6 migraines per month.  Denies sinus symptoms. Denies stressors and has been hydrating well. Has never seen a neurologist and never tried migraine like meds.  Her pain is described as throbbing and constant.  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  Pain level right now 7/10.     Past Medical History:  Diagnosis Date  . Abnormal uterine bleeding unrelated to menstrual cycle 08/25/2015  . Asthma   . Chlamydia   . Migraine    migraines  . Uterine polyp     Patient Active Problem List   Diagnosis Date Noted  . Abnormal uterine bleeding unrelated to menstrual cycle 08/25/2015    Past Surgical History:  Procedure Laterality Date  . HYSTEROSCOPY W/D&C N/A 11/23/2016   Procedure: DILATATION AND CURETTAGE /HYSTEROSCOPY;  Surgeon: Catalina Antigua, MD;  Location: [facility];  Service: Gynecology;  Laterality: N/A;  . TONSILLECTOMY      OB History    Gravida  0   Para  0   Term  0   Preterm  0   AB  0   Living  0     SAB  0   TAB  0   Ectopic  0   Multiple  0   Live Births               Home Medications    Prior to Admission medications   Medication Sig Start Date End Date Taking? Authorizing Provider  cyclobenzaprine (FLEXERIL) 10 MG tablet Take 1 tablet (10 mg total) by mouth 2 (two) times daily as needed for muscle  spasms. 02/25/18   Rodriguez-Southworth, Nettie Elm, PA-C  ondansetron (ZOFRAN) 4 MG tablet Take 1 tablet (4 mg total) by mouth every 8 (eight) hours as needed for nausea or vomiting. 02/25/18   Rodriguez-Southworth, Nettie Elm, PA-C    Family History Family History  Problem Relation Age of Onset  . Diabetes Other   . Asthma Other   . Cancer Other   . Asthma Father   . Diabetes Mother   . Asthma Mother   . Hypertension Neg Hx     Social History Social History   Tobacco Use  . Smoking status: Current Some Day Smoker    Types: Cigars  . Smokeless tobacco: Never Used  Substance Use Topics  . Alcohol use: Yes    Comment: occasionally  . Drug use: No     Allergies   Patient has no known allergies.   Review of Systems Review of Systems  Constitutional: Negative for chills, diaphoresis and fever.  HENT: Negative.   Eyes: Negative for discharge and visual disturbance.  Respiratory: Negative for cough.   Gastrointestinal: Positive for nausea. Negative for vomiting.  Genitourinary: Negative for dysuria.  Musculoskeletal: Positive  for neck pain. Negative for gait problem and neck stiffness.       Feels pain on her upper back and posterior neck when she looks down.   Skin: Negative for rash.  Neurological: Positive for dizziness and headaches. Negative for weakness and numbness.       " I feel almost loopy" is how she describes the HA   Physical Exam Triage Vital Signs ED Triage Vitals  Enc Vitals Group     BP 02/25/18 1718 127/64     Pulse Rate 02/25/18 1718 82     Resp 02/25/18 1718 18     Temp 02/25/18 1718 98.7 F (37.1 C)     Temp Source 02/25/18 1718 Oral     SpO2 02/25/18 1718 100 %     Weight --      Height --      Head Circumference --      Peak Flow --      Pain Score 02/25/18 1717 7     Pain Loc --      Pain Edu? --      Excl. in GC? --    No data found.  Updated Vital Signs BP 127/64 (BP Location: Right Arm)   Pulse 82   Temp 98.7 F (37.1 C) (Oral)    Resp 18   LMP 02/11/2018   SpO2 100%   Visual Acuity Right Eye Distance:   Left Eye Distance:   Bilateral Distance:    Right Eye Near:   Left Eye Near:    Bilateral Near:     Physical Exam Vitals signs and nursing note reviewed.  Constitutional:      General: She is not in acute distress.    Appearance: She is obese. She is not toxic-appearing.  HENT:     Head: Normocephalic.     Right Ear: Tympanic membrane and ear canal normal.     Left Ear: Tympanic membrane and ear canal normal.     Nose: Nose normal.     Mouth/Throat:     Mouth: Mucous membranes are moist.  Eyes:     General: No scleral icterus.       Right eye: No discharge.        Left eye: No discharge.     Extraocular Movements: Extraocular movements intact.     Conjunctiva/sclera: Conjunctivae normal.     Pupils: Pupils are equal, round, and reactive to light.     Comments: She was not photophobic  Neck:     Musculoskeletal: Neck supple. Muscular tenderness present. No neck rigidity.     Comments: Has local tenderness on both trapezius muscles up to occipital insertion region.  Cardiovascular:     Rate and Rhythm: Normal rate and regular rhythm.     Heart sounds: No murmur.  Pulmonary:     Effort: Pulmonary effort is normal.     Breath sounds: Normal breath sounds.  Abdominal:     General: Abdomen is flat. Bowel sounds are normal. There is no distension.     Tenderness: There is no abdominal tenderness.  Musculoskeletal: Normal range of motion.  Lymphadenopathy:     Cervical: No cervical adenopathy.  Skin:    General: Skin is warm and dry.     Coloration: Skin is not jaundiced.     Findings: No rash.  Neurological:     General: No focal deficit present.     Mental Status: She is alert and oriented to person, place, and time.  Cranial Nerves: No cranial nerve deficit.     Sensory: No sensory deficit.     Motor: No weakness.     Coordination: Coordination normal.     Gait: Gait normal.     Deep  Tendon Reflexes: Reflexes normal.  Psychiatric:        Mood and Affect: Mood normal.        Behavior: Behavior normal.        Thought Content: Thought content normal.        Judgment: Judgment normal.    UC Treatments / Results  Labs (all labs ordered are listed, but only abnormal results are displayed) Labs Reviewed - No data to display  EKG None  Radiology No results found.  Procedures Procedures   Medications Ordered in UC Medications  ketorolac (TORADOL) injection 60 mg (60 mg Intramuscular Given 02/25/18 1746)  ondansetron (ZOFRAN-ODT) disintegrating tablet 4 mg (4 mg Oral Given 02/25/18 1746)    Initial Impression / Assessment and Plan / UC Course  I have reviewed the triage vital signs and the nursing notes. I believe she has cluster HA's and her neck position may also be contributing to this. See instruction. She was given Toradol 60 mg IM and Zofran 4 mg ODT, HA decreased to 6/10.  See instructions. Work for note given .     Final Clinical Impressions(s) / UC Diagnoses   Final diagnoses:  Chronic cluster headache, not intractable     Discharge Instructions     Work on avoiding looking down all the time which is causing your neck to be forward and this is pulling on your trapezius muscles. You may benefit from seeing a chiropractor to adjust your neck and keep it in alignment. Try excedrin the next time you feel one coming on. Also get establish with a provider to see if you may benefit from preventive medication.   I will have you try a muscle relaxer for your neck tense muscles.     ED Prescriptions    Medication Sig Dispense Auth. Provider   cyclobenzaprine (FLEXERIL) 10 MG tablet Take 1 tablet (10 mg total) by mouth 2 (two) times daily as needed for muscle spasms. 20 tablet Rodriguez-Southworth, Amelita Risinger, PA-C   ondansetron (ZOFRAN) 4 MG tablet Take 1 tablet (4 mg total) by mouth every 8 (eight) hours as needed for nausea or vomiting. 4 tablet  Rodriguez-Southworth, Nettie ElmSylvia, PA-C     Controlled Substance Prescriptions [place] Controlled Substance Registry consulted?    Garey HamRodriguez-Southworth, Aiza Vollrath, Cordelia Poche-C 02/25/18 2009

## 2018-02-25 NOTE — ED Triage Notes (Signed)
The patient presented to the [facility] with a complaint of a headache x 4 days. The patient reported a hx of migraine headaches.

## 2018-02-25 NOTE — Discharge Instructions (Addendum)
Work on avoiding looking down all the time which is causing your neck to be forward and this is pulling on your trapezius muscles. You may benefit from seeing a chiropractor to adjust your neck and keep it in alignment. Try excedrin the next time you feel one coming on. Also get establish with a provider to see if you may benefit from preventive medication.   I will have you try a muscle relaxer for your neck tense muscles.

## 2018-03-26 ENCOUNTER — Encounter (HOSPITAL_COMMUNITY): Payer: Self-pay | Admitting: Emergency Medicine

## 2018-03-26 ENCOUNTER — Ambulatory Visit (HOSPITAL_COMMUNITY)
Admission: EM | Admit: 2018-03-26 | Discharge: 2018-03-26 | Disposition: A | Discharge status: Home / Self Care | Payer: PRIVATE HEALTH INSURANCE | Attending: Family Medicine | Admitting: Family Medicine

## 2018-03-26 DIAGNOSIS — G43019 Migraine without aura, intractable, without status migrainosus: Secondary | ICD-10-CM

## 2018-03-26 MED ORDER — TOPIRAMATE 50 MG PO TABS: 50.0000 mg | ORAL_TABLET | Freq: Every day | ORAL | 5 refills | Status: DC

## 2018-03-26 NOTE — ED Triage Notes (Signed)
Pt states shes had a headache and pressure in her eyes for the last few days.

## 2018-03-26 NOTE — ED Provider Notes (Addendum)
[facility]    CSN: [id number] Arrival date & time: 03/26/18  1947     History   Chief Complaint Chief Complaint  Patient presents with  . Headache    HPI Paula Hawkins is a 23 y.o. female.    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  Pt states shes had a headache and pressure in her eyes for the last few days.  She was last here seen 1 month ago.  Patient's dull headache never completely cleared.  She sometimes has trouble focusing.  The headache worsened about 3 days ago and has persisted.  She works day shift at [employer]      Past Medical History:  Diagnosis Date  . Abnormal uterine bleeding unrelated to menstrual cycle 08/25/2015  . Asthma   . Chlamydia   . Migraine    migraines  . Uterine polyp     Patient Active Problem List   Diagnosis Date Noted  . Abnormal uterine bleeding unrelated to menstrual cycle 08/25/2015    Past Surgical History:  Procedure Laterality Date  . HYSTEROSCOPY W/D&C N/A 11/23/2016   Procedure: DILATATION AND CURETTAGE /HYSTEROSCOPY;  Surgeon: Catalina Antigua, MD;  Location: [facility];  Service: Gynecology;  Laterality: N/A;  . TONSILLECTOMY      OB History    Gravida  0   Para  0   Term  0   Preterm  0   AB  0   Living  0     SAB  0   TAB  0   Ectopic  0   Multiple  0   Live Births               Home Medications    Prior to Admission medications   Medication Sig Start Date End Date Taking? Authorizing Provider  topiramate (TOPAMAX) 50 MG tablet Take 1 tablet (50 mg total) by mouth at bedtime. 03/26/18   Elvina Sidle, MD    Family History Family History  Problem Relation Age of Onset  . Diabetes Other   . Asthma Other   . Cancer Other   . Asthma Father   . Diabetes Mother   . Asthma Mother   . Hypertension Neg Hx     Social History Social History   Tobacco Use  . Smoking status:  Current Some Day Smoker    Types: Cigars  . Smokeless tobacco: Never Used  Substance Use Topics  . Alcohol use: Yes    Comment: occasionally  . Drug use: No     Allergies   Patient has no known allergies.   Review of Systems Review of Systems   Physical Exam Triage Vital Signs ED Triage Vitals  Enc Vitals Group     BP 03/26/18 1956 (!) 154/98     Pulse Rate 03/26/18 1956 100     Resp 03/26/18 1956 18     Temp 03/26/18 1956 98.9 F (37.2 C)     Temp src --      SpO2 03/26/18 1956 100 %     Weight --      Height --      Head Circumference --      Peak Flow --      Pain Score 03/26/18 1958 8     Pain Loc --      Pain Edu? --      Excl. in  GC? --    No data found.  Updated Vital Signs BP (!) 154/98   Pulse 100   Temp 98.9 F (37.2 C)   Resp 18   LMP 03/15/2018   SpO2 100%    Physical Exam Vitals signs and nursing note reviewed.  Constitutional:      Appearance: She is well-developed. She is obese.  HENT:     Head: Normocephalic and atraumatic.     Mouth/Throat:     Mouth: Mucous membranes are moist.     Pharynx: Oropharynx is clear.  Eyes:     Extraocular Movements: Extraocular movements intact.     Pupils: Pupils are equal, round, and reactive to light.     Comments: Mild injection  Neck:     Musculoskeletal: Normal range of motion and neck supple.  Neurological:     Mental Status: She is alert.     Cranial Nerves: No cranial nerve deficit.     Coordination: Romberg sign negative.  Psychiatric:        Mood and Affect: Mood normal.        Speech: Speech normal.        Behavior: Behavior normal.      UC Treatments / Results  Labs (all labs ordered are listed, but only abnormal results are displayed) Labs Reviewed - No data to display  EKG None  Radiology No results found.  Procedures Procedures (including critical care time)  Medications Ordered in UC Medications - No data to display  Initial Impression / Assessment and Plan /  UC Course  I have reviewed the triage vital signs and the nursing notes.  Pertinent labs & imaging results that were available during my care of the patient were reviewed by me and considered in my medical decision making (see chart for details).    Final Clinical Impressions(s) / UC Diagnoses   Final diagnoses:  Intractable migraine without aura and without status migrainosus   Discharge Instructions   None    ED Prescriptions    Medication Sig Dispense Auth. Provider   topiramate (TOPAMAX) 50 MG tablet Take 1 tablet (50 mg total) by mouth at bedtime. 30 tablet Elvina Sidle, MD     Controlled Substance Prescriptions [place] Controlled Substance Registry consulted? Not Applicable   Elvina Sidle, MD 03/26/18 2016    Elvina Sidle, MD 03/26/18 2020

## 2019-02-08 ENCOUNTER — Encounter (HOSPITAL_COMMUNITY): Payer: Self-pay

## 2019-02-08 ENCOUNTER — Ambulatory Visit (HOSPITAL_COMMUNITY)
Admission: EM | Admit: 2019-02-08 | Discharge: 2019-02-08 | Disposition: A | Discharge status: Home / Self Care | Payer: HRSA Program | Attending: Family Medicine | Admitting: Family Medicine

## 2019-02-08 ENCOUNTER — Other Ambulatory Visit: Payer: Self-pay

## 2019-02-08 DIAGNOSIS — Z20822 Contact with and (suspected) exposure to covid-19: Secondary | ICD-10-CM | POA: Diagnosis not present

## 2019-02-08 NOTE — ED Triage Notes (Addendum)
Pt. States she was around a coworker on Sunday, her coworker called her Wednesday to let her know she tested POSITIVE for COVID. Pt. Denies ANY symptoms, wants COVID testing.

## 2019-02-08 NOTE — Discharge Instructions (Signed)
Self isolate until covid results are back and negative.  Will notify you by phone of any positive findings. Your negative results will be sent through your MyChart.     Further recommendations and isolation would be discussed if found to be positive.

## 2019-02-08 NOTE — ED Provider Notes (Signed)
[facility]    CSN: [id number] Arrival date & time: 02/08/19  1059      History   Chief Complaint Chief Complaint  Patient presents with  . COVID exposure    HPI Paula Hawkins is a 24 y.o. female.   Paula Hawkins presents with requests for covid screening. She found out two days ago that her coworker tested positive. She was around this coworker last on 1/3. She denies any known symptoms of covid-19. She has no acute complaints today.    ROS per HPI, negative if not otherwise mentioned.      Past Medical History:  Diagnosis Date  . Abnormal uterine bleeding unrelated to menstrual cycle 08/25/2015  . Asthma   . Chlamydia   . Migraine    migraines  . Uterine polyp     Patient Active Problem List   Diagnosis Date Noted  . Abnormal uterine bleeding unrelated to menstrual cycle 08/25/2015    Past Surgical History:  Procedure Laterality Date  . HYSTEROSCOPY WITH D & C N/A 11/23/2016   Procedure: DILATATION AND CURETTAGE /HYSTEROSCOPY;  Surgeon: Catalina Antigua, MD;  Location: [facility];  Service: Gynecology;  Laterality: N/A;  . TONSILLECTOMY      OB History    Gravida  0   Para  0   Term  0   Preterm  0   AB  0   Living  0     SAB  0   TAB  0   Ectopic  0   Multiple  0   Live Births               Home Medications    Prior to Admission medications   Medication Sig Start Date End Date Taking? Authorizing Provider  topiramate (TOPAMAX) 50 MG tablet Take 1 tablet (50 mg total) by mouth at bedtime. 03/26/18   Elvina Sidle, MD    Family History Family History  Problem Relation Age of Onset  . Diabetes Other   . Asthma Other   . Cancer Other   . Asthma Father   . Diabetes Mother   . Asthma Mother   . Hypertension Neg Hx     Social History Social History   Tobacco Use  . Smoking status: Current Some Day Smoker    Types: Cigars  . Smokeless tobacco: Never Used  Substance Use Topics  . Alcohol  use: Yes    Comment: occasionally  . Drug use: No     Allergies   Patient has no known allergies.   Review of Systems Review of Systems   Physical Exam Triage Vital Signs ED Triage Vitals  Enc Vitals Group     BP 02/08/19 1147 136/79     Pulse Rate 02/08/19 1147 95     Resp 02/08/19 1147 19     Temp 02/08/19 1147 98.8 F (37.1 C)     Temp Source 02/08/19 1147 Oral     SpO2 02/08/19 1147 100 %     Weight 02/08/19 1144 258 lb (117 kg)     Height --      Head Circumference --      Peak Flow --      Pain Score 02/08/19 1144 0     Pain Loc --      Pain Edu? --      Excl. in GC? --    No data found.  Updated Vital Signs BP 136/79 (BP Location: Left Arm)  Pulse 95   Temp 98.8 F (37.1 C) (Oral)   Resp 19   Wt 258 lb (117 kg)   LMP 01/29/2019   SpO2 100%   BMI 38.10 kg/m   Visual Acuity Right Eye Distance:   Left Eye Distance:   Bilateral Distance:    Right Eye Near:   Left Eye Near:    Bilateral Near:     Physical Exam Constitutional:      General: She is not in acute distress.    Appearance: She is well-developed.  Cardiovascular:     Rate and Rhythm: Normal rate.  Pulmonary:     Effort: Pulmonary effort is normal.  Skin:    General: Skin is warm and dry.  Neurological:     Mental Status: She is alert and oriented to person, place, and time.      UC Treatments / Results  Labs (all labs ordered are listed, but only abnormal results are displayed) Labs Reviewed  NOVEL CORONAVIRUS, NAA (HOSP ORDER, SEND-OUT TO REF LAB; TAT 18-24 HRS)    EKG   Radiology No results found.  Procedures Procedures (including critical care time)  Medications Ordered in UC Medications - No data to display  Initial Impression / Assessment and Plan / UC Course  I have reviewed the triage vital signs and the nursing notes.  Pertinent labs & imaging results that were available during my care of the patient were reviewed by me and considered in my medical  decision making (see chart for details).     No acute complaints. Exposure to covid-19 with screening collected and pending. Isolation encouraged. Return precautions provided. Patient verbalized understanding and agreeable to plan.   Final Clinical Impressions(s) / UC Diagnoses   Final diagnoses:  Exposure to COVID-19 virus  Encounter for laboratory testing for COVID-19 virus     Discharge Instructions     Self isolate until covid results are back and negative.  Will notify you by phone of any positive findings. Your negative results will be sent through your MyChart.     Further recommendations and isolation would be discussed if found to be positive.    ED Prescriptions    None     PDMP not reviewed this encounter.   Zigmund Gottron, NP 02/08/19 1250

## 2019-02-09 LAB — NOVEL CORONAVIRUS, NAA (HOSP ORDER, SEND-OUT TO REF LAB; TAT 18-24 HRS): SARS-CoV-2, NAA: NOT DETECTED

## 2019-08-28 ENCOUNTER — Encounter (HOSPITAL_COMMUNITY): Payer: Self-pay

## 2019-08-28 ENCOUNTER — Other Ambulatory Visit: Payer: Self-pay

## 2019-08-28 ENCOUNTER — Ambulatory Visit (HOSPITAL_COMMUNITY)
Admission: EM | Admit: 2019-08-28 | Discharge: 2019-08-28 | Disposition: A | Discharge status: Home / Self Care | Payer: PRIVATE HEALTH INSURANCE | Attending: Family Medicine | Admitting: Family Medicine

## 2019-08-28 DIAGNOSIS — G43819 Other migraine, intractable, without status migrainosus: Secondary | ICD-10-CM

## 2019-08-28 MED ORDER — PREDNISONE 10 MG (21) PO TBPK: ORAL_TABLET | Freq: Every day | ORAL | 0 refills | Status: DC

## 2019-08-28 NOTE — ED Triage Notes (Signed)
Pt presents with migraine x 1 month. Reports trying stretching, different sleep positions, otc medication and has not had any relief. Reports pain is on the right upper side of her head and on the right back side of her head. Reports her eyes feel sore to touch.

## 2019-08-31 NOTE — ED Provider Notes (Signed)
[facility]   [id number] 08/28/19 Arrival Time: 1758  ASSESSMENT & PLAN:  1. Other migraine without status migrainosus, intractable     Meds ordered this encounter  Medications  . predniSONE (STERAPRED UNI-PAK 21 TAB) 10 MG (21) TBPK tablet    Sig: Take by mouth daily. Take as directed.    Dispense:  21 tablet    Refill:  0    Declines IM medications today. Work note provided. Normal neurological exam. Afebrile without nuchal rigidity. Current presentation and symptoms are consistent with prior migraines and are not consistent with SAH, ICH, meningitis, or temporal arteritis. Without fever, focal neuro logical deficits, nuchal rigidity, or change in vision. No indication for neurodiagnostic workup at this time. Discussed.  Recommend:  Follow-up Information    [facility].   Contact information: [address] [place], [address] [place] [address] [phone]               Reviewed expectations re: course of current medical issues. Questions answered. Outlined signs and symptoms indicating need for more acute intervention. Patient verbalized understanding. After Visit Summary given.   SUBJECTIVE: History from: Patient. Patient is able to give a clear and coherent history.  TIANNA BAUS is a 24 y.o. female who presents with complaint of a migraine headache. Long h/o migraines. "Nothing really works". Would like a work note. Current exacerbation on/off over past month. R frontal. "Eyes sore to touch". No photophobia. Ambulatory without difficulty.    OBJECTIVE:  Vitals:   08/28/19 2004  BP: (!) 140/86  Pulse: 76  Resp: 18  Temp: 98.6 F (37 C)  SpO2: 100%    General appearance: alert; NAD HENT: normocephalic; atraumatic Eyes: PERRLA; EOMI; conjunctivae normal Neck: supple with FROM Lungs: unlabored respirations Extremities: no edema; symmetrical with no gross deformities Skin: warm and dry Neurologic: alert; speech is  fluent and clear without dysarthria or aphasia; CN 2-12 grossly intact; no facial droop; normal gait Psychological: alert and cooperative; normal mood and affect    No Known Allergies  Past Medical History:  Diagnosis Date  . Abnormal uterine bleeding unrelated to menstrual cycle 08/25/2015  . Asthma   . Chlamydia   . Migraine    migraines  . Uterine polyp    Social History   Socioeconomic History  . Marital status: Single    Spouse name: Not on file  . Number of children: Not on file  . Years of education: Not on file  . Highest education level: Not on file  Occupational History  . Not on file  Tobacco Use  . Smoking status: Current Some Day Smoker    Types: Cigars  . Smokeless tobacco: Never Used  Vaping Use  . Vaping Use: Never used  Substance and Sexual Activity  . Alcohol use: Yes    Comment: occasionally  . Drug use: No  . Sexual activity: Yes    Birth control/protection: None  Other Topics Concern  . Not on file  Social History Narrative  . Not on file   Social Determinants of Health   Financial Resource Strain:   . Difficulty of Paying Living Expenses:   Food Insecurity:   . Worried About Programme researcher, broadcasting/film/video in the Last Year:   . Barista in the Last Year:   Transportation Needs:   . Freight forwarder (Medical):   Marland Kitchen Lack of Transportation (Non-Medical):   Physical Activity:   . Days of Exercise per Week:   .  Minutes of Exercise per Session:   Stress:   . Feeling of Stress :   Social Connections:   . Frequency of Communication with Friends and Family:   . Frequency of Social Gatherings with Friends and Family:   . Attends Religious Services:   . Active Member of Clubs or Organizations:   . Attends Banker Meetings:   Marland Kitchen Marital Status:   Intimate Partner Violence:   . Fear of Current or Ex-Partner:   . Emotionally Abused:   Marland Kitchen Physically Abused:   . Sexually Abused:    Family History  Problem Relation Age of Onset    . Diabetes Other   . Asthma Other   . Cancer Other   . Asthma Father   . Diabetes Mother   . Asthma Mother   . Hypertension Neg Hx    Past Surgical History:  Procedure Laterality Date  . HYSTEROSCOPY WITH D & C N/A 11/23/2016   Procedure: DILATATION AND CURETTAGE /HYSTEROSCOPY;  Surgeon: Catalina Antigua, MD;  Location: [facility];  Service: Gynecology;  Laterality: N/A;  . Greig Right, MD 08/31/19 [phone]

## 2020-01-05 ENCOUNTER — Emergency Department (HOSPITAL_COMMUNITY): Payer: Self-pay

## 2020-01-05 ENCOUNTER — Encounter (HOSPITAL_COMMUNITY): Payer: Self-pay | Admitting: Emergency Medicine

## 2020-01-05 ENCOUNTER — Emergency Department (HOSPITAL_COMMUNITY)
Admission: EM | Admit: 2020-01-05 | Discharge: 2020-01-05 | Disposition: A | Discharge status: Home / Self Care | Payer: Self-pay | Attending: Emergency Medicine | Admitting: Emergency Medicine

## 2020-01-05 ENCOUNTER — Other Ambulatory Visit: Payer: Self-pay

## 2020-01-05 DIAGNOSIS — F17290 Nicotine dependence, other tobacco product, uncomplicated: Secondary | ICD-10-CM | POA: Insufficient documentation

## 2020-01-05 DIAGNOSIS — X58XXXA Exposure to other specified factors, initial encounter: Secondary | ICD-10-CM | POA: Insufficient documentation

## 2020-01-05 DIAGNOSIS — T189XXA Foreign body of alimentary tract, part unspecified, initial encounter: Secondary | ICD-10-CM | POA: Insufficient documentation

## 2020-01-05 DIAGNOSIS — J45909 Unspecified asthma, uncomplicated: Secondary | ICD-10-CM | POA: Insufficient documentation

## 2020-01-05 MED ORDER — ALUM & MAG HYDROXIDE-SIMETH 200-200-20 MG/5ML PO SUSP
30.0000 mL | Freq: Once | ORAL | Status: AC
  Administered 2020-01-05: 30 mL via ORAL
  Filled 2020-01-05: qty 30

## 2020-01-05 MED ORDER — SUCRALFATE 1 GM/10ML PO SUSP: 1.0000 g | Freq: Three times a day (TID) | ORAL | 0 refills | Status: DC | PRN

## 2020-01-05 MED ORDER — LIDOCAINE VISCOUS HCL 2 % MT SOLN
15.0000 mL | Freq: Once | OROMUCOSAL | Status: AC
  Administered 2020-01-05: 15 mL via ORAL
  Filled 2020-01-05: qty 15

## 2020-01-05 NOTE — ED Triage Notes (Signed)
Patient states that she was eating when her tongue ring came loose and she swallowed it. No cough, fever or shortness of breath, no nausea, or vomiting.

## 2020-01-05 NOTE — Discharge Instructions (Signed)
You were seen in the emergency department after swallowing your tongue ring.  Your x-ray show that the tongue ring looks to be in your intestines in the right lower part of your belly.  We are sending home with Carafate to take 3 times per day as needed for throat irritation.    We have prescribed you new medication(s) today. Discuss the medications prescribed today with your pharmacist as they can have adverse effects and interactions with your other medicines including over the counter and prescribed medications. Seek medical evaluation if you start to experience new or abnormal symptoms after taking one of these medicines, seek care immediately if you start to experience difficulty breathing, feeling of your throat closing, facial swelling, or rash as these could be indications of a more serious allergic reaction  The tongue ring will likely pass on its own.  Please follow with your primary care provider within 3 to 5 days.  Return to the ER for new or worsening symptoms including but not limited to abdominal pain, vomiting, inability to keep fluids down, inability to have a bowel movement, anal bleeding that does not resolve quickly, dark or tarry stools, bloody stools, or any other concerns.

## 2020-01-05 NOTE — ED Provider Notes (Signed)
[facility]-EMERGENCY DEPT Provider Note   CSN: [id number] Arrival date & time: 01/05/20  0208     History Chief Complaint  Patient presents with  . Swallowed Foreign Body    Tongue Ring    Paula Hawkins is a 24 y.o. female with a history of tobacco abuse and asthma who presents to the emergency department with concern for swallowed foreign body which occurred a little under 24 hours ago.  Patient states that she swallowed her metal U-shaped tongue ring with a ball on it yesterday morning.  She states that she feels like it is stuck just above her breastbone.  It is somewhat uncomfortable but not particularly painful.  No alleviating or aggravating factors.  She has tried to make herself vomit without relief.  Unless being self-induced she is not having episodes of emesis.  She is tolerating own secretions without difficulty.  She denies any chest pain, dyspnea, or abdominal pain.  She denies any blood in vomit or stool.  HPI     Past Medical History:  Diagnosis Date  . Abnormal uterine bleeding unrelated to menstrual cycle 08/25/2015  . Asthma   . Chlamydia   . Migraine    migraines  . Uterine polyp     Patient Active Problem List   Diagnosis Date Noted  . Abnormal uterine bleeding unrelated to menstrual cycle 08/25/2015    Past Surgical History:  Procedure Laterality Date  . HYSTEROSCOPY WITH D & C N/A 11/23/2016   Procedure: DILATATION AND CURETTAGE /HYSTEROSCOPY;  Surgeon: Catalina Antigua, MD;  Location: [facility];  Service: Gynecology;  Laterality: N/A;  . TONSILLECTOMY       OB History    Gravida  0   Para  0   Term  0   Preterm  0   AB  0   Living  0     SAB  0   TAB  0   Ectopic  0   Multiple  0   Live Births              Family History  Problem Relation Age of Onset  . Diabetes Other   . Asthma Other   . Cancer Other   . Asthma Father   . Diabetes Mother   . Asthma Mother   . Hypertension Neg  Hx     Social History   Tobacco Use  . Smoking status: Current Some Day Smoker    Types: Cigars  . Smokeless tobacco: Never Used  Vaping Use  . Vaping Use: Never used  Substance Use Topics  . Alcohol use: Yes    Comment: occasionally  . Drug use: No    Home Medications Prior to Admission medications   Medication Sig Start Date End Date Taking? Authorizing Provider  predniSONE (STERAPRED UNI-PAK 21 TAB) 10 MG (21) TBPK tablet Take by mouth daily. Take as directed. Patient not taking: Reported on 01/05/2020 08/28/19   Mardella Layman, MD  topiramate (TOPAMAX) 50 MG tablet Take 1 tablet (50 mg total) by mouth at bedtime. Patient not taking: Reported on 01/05/2020 03/26/18   Elvina Sidle, MD    Allergies    Patient has no known allergies.  Review of Systems   Review of Systems  Constitutional: Negative for chills and fever.  Respiratory: Negative for choking and shortness of breath.   Cardiovascular: Negative for chest pain.  Gastrointestinal: Positive for vomiting (self induced, none otherwise). Negative for abdominal pain and blood in  stool.  Genitourinary: Negative for dysuria.  Neurological: Negative for syncope.  All other systems reviewed and are negative.   Physical Exam Updated Vital Signs BP (!) 156/104 (BP Location: Left Arm)   Pulse 94   Temp 98.5 F (36.9 C) (Oral)   Resp 16   SpO2 100%   Physical Exam Vitals and nursing note reviewed.  Constitutional:      General: She is not in acute distress.    Appearance: She is well-developed. She is not toxic-appearing.  HENT:     Head: Normocephalic and atraumatic.     Mouth/Throat:     Pharynx: Oropharynx is clear. No oropharyngeal exudate or posterior oropharyngeal erythema.     Comments: Uvula midline. Posterior oropharynx is symmetric appearing. Patient tolerating own secretions without difficulty. No trismus. No drooling. No hot potato voice. No swelling beneath the tongue, submandibular compartment is  soft.   Eyes:     General:        Right eye: No discharge.        Left eye: No discharge.     Conjunctiva/sclera: Conjunctivae normal.  Cardiovascular:     Rate and Rhythm: Normal rate and regular rhythm.  Pulmonary:     Effort: Pulmonary effort is normal. No respiratory distress.     Breath sounds: Normal breath sounds. No wheezing, rhonchi or rales.  Abdominal:     General: There is no distension.     Palpations: Abdomen is soft.     Tenderness: There is no abdominal tenderness. There is no guarding or rebound.  Musculoskeletal:     Cervical back: Normal range of motion and neck supple. No rigidity or tenderness.  Lymphadenopathy:     Cervical: No cervical adenopathy.  Skin:    General: Skin is warm and dry.     Findings: No rash.  Neurological:     Mental Status: She is alert.     Comments: Clear speech.   Psychiatric:        Behavior: Behavior normal.     ED Results / Procedures / Treatments   Labs (all labs ordered are listed, but only abnormal results are displayed) Labs Reviewed  I-STAT BETA HCG BLOOD, ED ([facility], [facility], [facility] ONLY)    EKG None  Radiology DG Chest 2 View  Result Date: 01/05/2020 CLINICAL DATA:  Foreign body EXAM: CHEST - 2 VIEW COMPARISON:  Chest x-ray dated 10/15/2017. FINDINGS: Heart size and mediastinal contours are within normal limits. Lungs are clear. No pleural effusion. No radiodense foreign body is appreciated within the chest. IMPRESSION: Negative. Electronically Signed   By: Bary Richard M.D.   On: 01/05/2020 05:47   DG Abdomen 1 View  Result Date: 01/05/2020 CLINICAL DATA:  Patient states that she was eating when her tongue ring came loose and she swallowed it. EXAM: ABDOMEN - 1 VIEW COMPARISON:  None. FINDINGS: The metallic ring is seen within the RIGHT lower quadrant, anatomically most likely to be at the junction of the terminal ileum and proximal colon. No dilated large or small bowel loops. No evidence of free intraperitoneal air,  although the upper abdomen is excluded on this exam. Calcification in the LEFT low pelvis is most likely a benign phlebolith. IMPRESSION: Metallic ring is in the RIGHT lower quadrant, most likely at the junction of the terminal ileum and proximal colon. Electronically Signed   By: Bary Richard M.D.   On: 01/05/2020 06:28    Procedures Procedures (including critical care time)  Medications Ordered in ED  Medications - No data to display  ED Course  I have reviewed the triage vital signs and the nursing notes.  Pertinent labs & imaging results that were available during my care of the patient were reviewed by me and considered in my medical decision making (see chart for details).    MDM Rules/Calculators/A&P                          Patient presents to the ED with concern for swallowed FB.  Nontoxic, resting comfortably, vitals with elevated BP, doubt hypertensive emergency.  Benign physical exam.  Handling own secretions without difficulty, airway is patent.  Additional history obtained:  Additional history obtained from chart review & nursing note review. . Imaging Studies ordered:  I ordered imaging studies which included CXR & abdominal x-ray, I independently visualized and interpreted imaging which showed Metallic ring is in the RIGHT lower quadrant, most likely at the junction of the terminal ileum and proximal colon.   Suspect sensation around the sternal notch patient is described is likely irritation from tongue ring passage. Given GI cocktail to help with this. No perf/obstruction. Will likely pass on its own through GI tract. Will discharge home with carafate, PCP follow up. I discussed results, treatment plan, need for follow-up, and return precautions with the patient. Provided opportunity for questions, patient confirmed understanding and is in agreement with plan.   Findings and plan of care discussed with supervising physician Dr. Preston Fleeting who is in agreement.   Portions of  this note were generated with Scientist, clinical (histocompatibility and immunogenetics). Dictation errors may occur despite best attempts at proofreading.  Final Clinical Impression(s) / ED Diagnoses Final diagnoses:  Swallowed foreign body, initial encounter    Rx / DC Orders ED Discharge Orders         Ordered    sucralfate (CARAFATE) 1 GM/10ML suspension  3 times daily PRN        01/05/20 0637           Cherly Anderson, PA-C 01/05/20 3614    Dione Booze, MD 01/05/20 [id number]

## 2020-03-14 ENCOUNTER — Emergency Department (HOSPITAL_COMMUNITY)
Admission: EM | Admit: 2020-03-14 | Discharge: 2020-03-14 | Disposition: A | Discharge status: Home / Self Care | Payer: Self-pay | Attending: Emergency Medicine | Admitting: Emergency Medicine

## 2020-03-14 ENCOUNTER — Emergency Department (HOSPITAL_COMMUNITY): Payer: Self-pay

## 2020-03-14 ENCOUNTER — Encounter (HOSPITAL_COMMUNITY): Payer: Self-pay | Admitting: Emergency Medicine

## 2020-03-14 ENCOUNTER — Other Ambulatory Visit: Payer: Self-pay

## 2020-03-14 ENCOUNTER — Emergency Department (HOSPITAL_BASED_OUTPATIENT_CLINIC_OR_DEPARTMENT_OTHER): Payer: Self-pay

## 2020-03-14 DIAGNOSIS — M25562 Pain in left knee: Secondary | ICD-10-CM

## 2020-03-14 DIAGNOSIS — R000 Tachycardia, unspecified: Secondary | ICD-10-CM | POA: Insufficient documentation

## 2020-03-14 DIAGNOSIS — R2242 Localized swelling, mass and lump, left lower limb: Secondary | ICD-10-CM | POA: Insufficient documentation

## 2020-03-14 DIAGNOSIS — R609 Edema, unspecified: Secondary | ICD-10-CM

## 2020-03-14 DIAGNOSIS — M79605 Pain in left leg: Secondary | ICD-10-CM | POA: Insufficient documentation

## 2020-03-14 DIAGNOSIS — F17290 Nicotine dependence, other tobacco product, uncomplicated: Secondary | ICD-10-CM | POA: Insufficient documentation

## 2020-03-14 DIAGNOSIS — J45909 Unspecified asthma, uncomplicated: Secondary | ICD-10-CM | POA: Insufficient documentation

## 2020-03-14 MED ORDER — NAPROXEN 500 MG PO TABS: 500.0000 mg | ORAL_TABLET | Freq: Two times a day (BID) | ORAL | 0 refills | Status: DC

## 2020-03-14 NOTE — ED Triage Notes (Signed)
Patient reports swelling to left lower leg x2 days without injury.

## 2020-03-14 NOTE — Progress Notes (Signed)
VASCULAR LAB    Left lower extremity venous duplex has been performed.  See CV proc for preliminary results.  Gave verbal report to [employer], PA-C Kenzley Ke, RVT 03/14/2020, 7:37 PM

## 2020-03-14 NOTE — ED Provider Notes (Signed)
[facility]-EMERGENCY DEPT Provider Note   CSN: [id number] Arrival date & time: 03/14/20  1633     History Chief Complaint  Patient presents with  . Leg Pain    Paula Hawkins is a 25 y.o. female.  HPI Patient is a 25 year old female with a medical history as noted below.  Patient reports atraumatic left knee pain as well as swelling to the lower leg for the past 2 days.  Her symptoms have been worsening.  States her pain started just inferior to the left knee and began radiating downwards in the leg.  Now reports mild swelling in the left lower leg and left foot.  No numbness or weakness.  Pain worsens with palpation as well as ambulation.  No chest pain or shortness of breath.  No oral estrogen use or hemoptysis.  No history of blood clots.  Patient does note that she works from her vehicle 3 days/week and is sitting for long periods of time.  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    Past Medical History:  Diagnosis Date  . Abnormal uterine bleeding unrelated to menstrual cycle 08/25/2015  . Asthma   . Chlamydia   . Migraine    migraines  . Uterine polyp     Patient Active Problem List   Diagnosis Date Noted  . Abnormal uterine bleeding unrelated to menstrual cycle 08/25/2015    Past Surgical History:  Procedure Laterality Date  . HYSTEROSCOPY WITH D & C N/A 11/23/2016   Procedure: DILATATION AND CURETTAGE /HYSTEROSCOPY;  Surgeon: Catalina Antigua, MD;  Location: [facility];  Service: Gynecology;  Laterality: N/A;  . TONSILLECTOMY       OB History    Gravida  0   Para  0   Term  0   Preterm  0   AB  0   Living  0     SAB  0   IAB  0   Ectopic  0   Multiple  0   Live Births              Family History  Problem Relation Age of Onset  . Diabetes Other   . Asthma Other   . Cancer Other   . Asthma Father   . Diabetes Mother   . Asthma Mother   .  Hypertension Neg Hx     Social History   Tobacco Use  . Smoking status: Current Some Day Smoker    Types: Cigars  . Smokeless tobacco: Never Used  Vaping Use  . Vaping Use: Never used  Substance Use Topics  . Alcohol use: Yes    Comment: occasionally  . Drug use: No    Home Medications Prior to Admission medications   Medication Sig Start Date End Date Taking? Authorizing Provider  naproxen (NAPROSYN) 500 MG tablet Take 1 tablet (500 mg total) by mouth 2 (two) times daily with a meal. 03/14/20  Yes Nura Cahoon, PA-C  sucralfate (CARAFATE) 1 GM/10ML suspension Take 10 mLs (1 g total) by mouth 3 (three) times daily as needed (throat irritation). 01/05/20   Petrucelli, Samantha R, PA-C  topiramate (TOPAMAX) 50 MG tablet Take 1 tablet (50 mg total) by mouth at bedtime. Patient not taking: Reported on 01/05/2020 03/26/18 01/05/20  Elvina Sidle, MD    Allergies    Patient has no known allergies.  Review of Systems   Review of Systems  All other systems reviewed and are negative. Ten systems reviewed and are negative for acute change, except as noted in the HPI.   Physical Exam Updated Vital Signs BP (!) 155/91 (BP Location: Left Arm)   Pulse 96   Temp 98.8 F (37.1 C) (Oral)   Resp 18   Ht  (1.753 m)   LMP 02/10/2020   SpO2 99%   BMI 38.10 kg/m   Physical Exam Vitals and nursing note reviewed.  Constitutional:      General: She is not in acute distress.    Appearance: Normal appearance. She is not ill-appearing, toxic-appearing or diaphoretic.  HENT:     Head: Normocephalic and atraumatic.     Right Ear: External ear normal.     Left Ear: External ear normal.     Nose: Nose normal.     Mouth/Throat:     Mouth: Mucous membranes are moist.     Pharynx: Oropharynx is clear. No oropharyngeal exudate or posterior oropharyngeal erythema.  Eyes:     Extraocular Movements: Extraocular movements intact.  Cardiovascular:     Rate and Rhythm: Regular rhythm.  Tachycardia present.     Pulses: Normal pulses.     Heart sounds: Normal heart sounds. No murmur heard. No friction rub. No gallop.   Pulmonary:     Effort: Pulmonary effort is normal. No respiratory distress.     Breath sounds: Normal breath sounds. No stridor. No wheezing, rhonchi or rales.  Abdominal:     General: Abdomen is flat.     Tenderness: There is no abdominal tenderness.  Musculoskeletal:        General: Swelling and tenderness present. Normal range of motion.     Cervical back: Normal range of motion and neck supple. No tenderness.     Left lower leg: Edema present.     Comments: Point of swelling as well as moderate TTP noted along the left knee just inferior to the patella.  No overlying erythema or signs of infection.  Trace edema noted in the left lower extremity through the tibial region as well as the dorsal aspect of the left foot.  Good cap refill in both feet.  Strong DP pulses.  Able to move the toes of both feet without difficulty.  Distal sensation intact.  Skin:    General: Skin is warm and dry.  Neurological:     General: No focal deficit present.     Mental Status: She is alert and oriented to person, place, and time.  Psychiatric:        Mood and Affect: Mood normal.        Behavior: Behavior normal.    ED Results / Procedures / Treatments   Labs (all labs ordered are listed, but only abnormal results are displayed) Labs Reviewed - No data to display  EKG None  Radiology DG Knee Complete 4 Views Left  Result Date: 03/14/2020 CLINICAL DATA:  Pt reported left anterior/lateral knee pain with some swelling x 3 days without any injury. Pt reported inability to straighten and "lock" her left knee to stand and walk. EXAM: LEFT KNEE - COMPLETE 4+ VIEW COMPARISON:  None. FINDINGS: No evidence of fracture, dislocation, or joint effusion. No evidence of arthropathy or other focal bone abnormality. Soft tissues are unremarkable. IMPRESSION: Negative.  Electronically Signed   By: Amie Portland M.D.   On: 03/14/2020 17:57   VAS [place] LOWER EXTREMITY VENOUS (DVT) (ONLY [facility] & [facility])  Result Date: 03/14/2020  Lower  Venous DVT Study Indications: Knee pain, edema.  Limitations: Body habitus and patient's inability to position leg secondary to pain. Comparison Study: No prior study Performing Technologist: Sherren Kernsandace Kanady RVS  Examination Guidelines: A complete evaluation includes B-mode imaging, spectral Doppler, color Doppler, and power Doppler as needed of all accessible portions of each vessel. Bilateral testing is considered an integral part of a complete examination. Limited examinations for reoccurring indications may be performed as noted. The reflux portion of the exam is performed with the patient in reverse Trendelenburg.  Right Technical Findings: Right leg not evaluated.  +---------+---------------+---------+-----------+----------+-------------------+ LEFT     CompressibilityPhasicitySpontaneityPropertiesThrombus Aging      +---------+---------------+---------+-----------+----------+-------------------+ CFV                     Yes      Yes                  patent by color and                                                       Doppler             +---------+---------------+---------+-----------+----------+-------------------+ FV Prox                 Yes      Yes                  patent by color and                                                       Doppler             +---------+---------------+---------+-----------+----------+-------------------+ FV Mid                                                patent by color and                                                       Doppler             +---------+---------------+---------+-----------+----------+-------------------+ FV Distal                                             Not well visualized  +---------+---------------+---------+-----------+----------+-------------------+ PFV                                                   Not well visualized +---------+---------------+---------+-----------+----------+-------------------+ POP                     Yes      Yes  patent by color and                                                       Doppler             +---------+---------------+---------+-----------+----------+-------------------+ PTV                                                   Not well visualized +---------+---------------+---------+-----------+----------+-------------------+ PERO                                                  Not well visualized +---------+---------------+---------+-----------+----------+-------------------+     Summary: LEFT: - There is no evidence of deep vein thrombosis in the lower extremity. However, portions of this examination were limited- see technologist comments above.  *See table(s) above for measurements and observations. Electronically signed by Coral Else MD on 03/14/2020 at 7:58:07 PM.    Final    Procedures Procedures   Medications Ordered in ED Medications - No data to display  ED Course  I have reviewed the triage vital signs and the nursing notes.  Pertinent labs & imaging results that were available during my care of the patient were reviewed by me and considered in my medical decision making (see chart for details).  Clinical Course as of 03/14/20 2004  Sat Mar 14, 2020  1942 VAS [place] LOWER EXTREMITY VENOUS (DVT) (ONLY [facility] & [facility]) No evidence of DVT in the lower extremity. [LJ]  1942 DG Knee Complete 4 Views Left Negative. [LJ]    Clinical Course User Index [LJ] Placido Sou, PA-C   MDM Rules/Calculators/A&P                          Pt is a 25 y.o. female due to left knee pain as well as left lower extremity swelling.  Atraumatic.  Started about 2 days ago.  Imaging: X-ray  of the left knee is negative. Ultrasound of the left leg is negative for DVT.  I, Placido Sou, PA-C, personally reviewed and evaluated these images and lab results as part of my medical decision-making.  Unsure of the cause of the patient's pain.  Likely musculoskeletal.  Does have a point of swelling and tenderness just inferior to the left patella.  Possibly bursitis.  Seems to worsen with range of motion of the knee as well as ambulation.  There was trace edema in the lower leg.  Very minimal.  Good pedal pulses.  Distal sensation intact.  Patient is ambulatory.  Will discharge on a course of naproxen.  Discussed icing and elevating the leg for the next few days.  PCP follow-up.  We will give patient Ortho follow-up if her symptoms have not improved in 1 to 2 weeks.  Discussed return precautions.  Her questions were answered and she was amicable at the time of discharge.  Note: Portions of this report may have been transcribed using voice recognition software. Every effort was made to ensure accuracy; however, inadvertent computerized transcription errors may be  present.   Final Clinical Impression(s) / ED Diagnoses Final diagnoses:  Left leg pain   Rx / DC Orders ED Discharge Orders         Ordered    naproxen (NAPROSYN) 500 MG tablet  2 times daily with meals        03/14/20 2001           Placido Sou, Cordelia Poche 03/14/20 2004    Arby Barrette, MD 03/23/20 1725

## 2020-03-14 NOTE — Discharge Instructions (Addendum)
Like we discussed, I prescribed you naproxen.  This is a strong anti-inflammatory medication you can take up to twice per day.  Please take this as prescribed.  Try to take it with a small amount of food, as it can cause stomach irritation.  Also, make sure that you are elevating your leg and applying ice to the knee.  This will help with pain as well as swelling.  I have given you follow-up with orthopedics below if you find your symptoms do not improve in the next 1 to 2 weeks.  You can always return to the emergency department if your symptoms worsen.  It was a pleasure to meet you.

## 2021-12-04 ENCOUNTER — Emergency Department (HOSPITAL_COMMUNITY): Payer: Self-pay

## 2021-12-04 ENCOUNTER — Other Ambulatory Visit: Payer: Self-pay

## 2021-12-04 ENCOUNTER — Inpatient Hospital Stay (HOSPITAL_COMMUNITY)
Admission: EM | Admit: 2021-12-04 | Discharge: 2021-12-10 | DRG: 958 | Disposition: A | Discharge status: Home Health | Payer: Self-pay | Attending: General Surgery | Admitting: General Surgery

## 2021-12-04 DIAGNOSIS — Z6838 Body mass index (BMI) 38.0-38.9, adult: Secondary | ICD-10-CM | POA: Diagnosis not present

## 2021-12-04 DIAGNOSIS — S91012A Laceration without foreign body, left ankle, initial encounter: Secondary | ICD-10-CM | POA: Diagnosis present

## 2021-12-04 DIAGNOSIS — Z818 Family history of other mental and behavioral disorders: Secondary | ICD-10-CM

## 2021-12-04 DIAGNOSIS — F430 Acute stress reaction: Secondary | ICD-10-CM | POA: Diagnosis not present

## 2021-12-04 DIAGNOSIS — S30811A Abrasion of abdominal wall, initial encounter: Secondary | ICD-10-CM | POA: Diagnosis present

## 2021-12-04 DIAGNOSIS — Z833 Family history of diabetes mellitus: Secondary | ICD-10-CM

## 2021-12-04 DIAGNOSIS — S32402A Unspecified fracture of left acetabulum, initial encounter for closed fracture: Secondary | ICD-10-CM | POA: Diagnosis present

## 2021-12-04 DIAGNOSIS — E669 Obesity, unspecified: Secondary | ICD-10-CM | POA: Diagnosis present

## 2021-12-04 DIAGNOSIS — F840 Autistic disorder: Secondary | ICD-10-CM | POA: Diagnosis present

## 2021-12-04 DIAGNOSIS — Z79899 Other long term (current) drug therapy: Secondary | ICD-10-CM | POA: Diagnosis not present

## 2021-12-04 DIAGNOSIS — S72002A Fracture of unspecified part of neck of left femur, initial encounter for closed fracture: Secondary | ICD-10-CM | POA: Diagnosis present

## 2021-12-04 DIAGNOSIS — S27321A Contusion of lung, unilateral, initial encounter: Secondary | ICD-10-CM | POA: Diagnosis present

## 2021-12-04 DIAGNOSIS — S329XXA Fracture of unspecified parts of lumbosacral spine and pelvis, initial encounter for closed fracture: Secondary | ICD-10-CM | POA: Diagnosis present

## 2021-12-04 DIAGNOSIS — Z825 Family history of asthma and other chronic lower respiratory diseases: Secondary | ICD-10-CM | POA: Diagnosis not present

## 2021-12-04 DIAGNOSIS — D62 Acute posthemorrhagic anemia: Secondary | ICD-10-CM | POA: Diagnosis not present

## 2021-12-04 DIAGNOSIS — S2231XA Fracture of one rib, right side, initial encounter for closed fracture: Secondary | ICD-10-CM | POA: Diagnosis present

## 2021-12-04 DIAGNOSIS — Z820 Family history of epilepsy and other diseases of the nervous system: Secondary | ICD-10-CM | POA: Diagnosis not present

## 2021-12-04 DIAGNOSIS — F4311 Post-traumatic stress disorder, acute: Secondary | ICD-10-CM | POA: Diagnosis present

## 2021-12-04 DIAGNOSIS — F17290 Nicotine dependence, other tobacco product, uncomplicated: Secondary | ICD-10-CM | POA: Diagnosis present

## 2021-12-04 DIAGNOSIS — S32592A Other specified fracture of left pubis, initial encounter for closed fracture: Principal | ICD-10-CM | POA: Diagnosis present

## 2021-12-04 DIAGNOSIS — G4700 Insomnia, unspecified: Secondary | ICD-10-CM | POA: Diagnosis not present

## 2021-12-04 LAB — BASIC METABOLIC PANEL
Anion gap: 10 (ref 5–15)
BUN: 8 mg/dL (ref 6–20)
CO2: 22 mmol/L (ref 22–32)
Calcium: 9 mg/dL (ref 8.9–10.3)
Chloride: 107 mmol/L (ref 98–111)
Creatinine, Ser: 1.04 mg/dL — ABNORMAL HIGH (ref 0.44–1.00)
GFR, Estimated: >60 mL/min (ref >60)
Glucose, Bld: 173 mg/dL — ABNORMAL HIGH (ref 70–99)
Potassium: 3.7 mmol/L (ref 3.5–5.1)
Sodium: 139 mmol/L (ref 135–145)

## 2021-12-04 LAB — CBC WITH DIFFERENTIAL/PLATELET
Abs Immature Granulocytes: 0.15 10*3/uL — ABNORMAL HIGH (ref 0.00–0.07)
Basophils Absolute: 0.1 10*3/uL (ref 0.0–0.1)
Basophils Relative: 0 %
Eosinophils Absolute: 0 10*3/uL (ref 0.0–0.5)
Eosinophils Relative: 0 %
HCT: 44.8 % (ref 36.0–46.0)
Hemoglobin: 15.2 g/dL — ABNORMAL HIGH (ref 12.0–15.0)
Immature Granulocytes: 1 %
Lymphocytes Relative: 11 %
Lymphs Abs: 2.7 10*3/uL (ref 0.7–4.0)
MCH: 32.9 pg (ref 26.0–34.0)
MCHC: 33.9 g/dL (ref 30.0–36.0)
MCV: 97 fL (ref 80.0–100.0)
Monocytes Absolute: 1 10*3/uL (ref 0.1–1.0)
Monocytes Relative: 4 %
Neutro Abs: 21.8 10*3/uL — ABNORMAL HIGH (ref 1.7–7.7)
Neutrophils Relative %: 84 %
Platelets: 353 10*3/uL (ref 150–400)
RBC: 4.62 MIL/uL (ref 3.87–5.11)
RDW: 12.6 % (ref 11.5–15.5)
WBC: 25.8 10*3/uL — ABNORMAL HIGH (ref 4.0–10.5)
nRBC: 0 % (ref 0.0–0.2)

## 2021-12-04 LAB — I-STAT BETA HCG BLOOD, ED (MC, WL, AP ONLY): I-stat hCG, quantitative: <5 m[IU]/mL (ref <5)

## 2021-12-04 MED ORDER — LACTATED RINGERS IV SOLN: INTRAVENOUS | Status: DC

## 2021-12-04 MED ORDER — IOHEXOL 350 MG/ML SOLN
100.0000 mL | Freq: Once | INTRAVENOUS | Status: AC | PRN
  Administered 2021-12-04: 100 mL via INTRAVENOUS

## 2021-12-04 MED ORDER — MORPHINE SULFATE (PF) 4 MG/ML IV SOLN
4.0000 mg | Freq: Once | INTRAVENOUS | Status: AC
  Administered 2021-12-04: 4 mg via INTRAVENOUS
  Filled 2021-12-04: qty 1

## 2021-12-04 MED ORDER — DOCUSATE SODIUM 100 MG PO CAPS
100.0000 mg | ORAL_CAPSULE | Freq: Two times a day (BID) | ORAL | Status: DC
  Administered 2021-12-05 – 2021-12-10 (×10): 100 mg via ORAL
  Filled 2021-12-04 (×11): qty 1

## 2021-12-04 MED ORDER — ONDANSETRON HCL 4 MG/2ML IJ SOLN
4.0000 mg | Freq: Four times a day (QID) | INTRAMUSCULAR | Status: DC | PRN
  Administered 2021-12-06 (×2): 4 mg via INTRAVENOUS
  Filled 2021-12-04 (×2): qty 2

## 2021-12-04 MED ORDER — OXYCODONE HCL 5 MG PO TABS
5.0000 mg | ORAL_TABLET | ORAL | Status: DC | PRN
  Administered 2021-12-05 – 2021-12-09 (×13): 10 mg via ORAL
  Filled 2021-12-04 (×12): qty 2

## 2021-12-04 MED ORDER — KETOROLAC TROMETHAMINE 15 MG/ML IJ SOLN: 30.0000 mg | Freq: Four times a day (QID) | INTRAMUSCULAR | Status: DC

## 2021-12-04 MED ORDER — MORPHINE SULFATE (PF) 4 MG/ML IV SOLN
4.0000 mg | INTRAVENOUS | Status: DC | PRN
  Administered 2021-12-06 – 2021-12-08 (×3): 4 mg via INTRAVENOUS
  Filled 2021-12-04 (×3): qty 1

## 2021-12-04 MED ORDER — LIDOCAINE HCL 2 % IJ SOLN
20.0000 mL | Freq: Once | INTRAMUSCULAR | Status: AC
  Administered 2021-12-04: 400 mg
  Filled 2021-12-04: qty 20

## 2021-12-04 MED ORDER — ACETAMINOPHEN 500 MG PO TABS
1000.0000 mg | ORAL_TABLET | Freq: Four times a day (QID) | ORAL | Status: DC
  Administered 2021-12-04 – 2021-12-10 (×19): 1000 mg via ORAL
  Filled 2021-12-04 (×21): qty 2

## 2021-12-04 MED ORDER — ENOXAPARIN SODIUM 30 MG/0.3ML IJ SOSY
30.0000 mg | PREFILLED_SYRINGE | Freq: Two times a day (BID) | INTRAMUSCULAR | Status: DC
  Administered 2021-12-05 – 2021-12-09 (×10): 30 mg via SUBCUTANEOUS
  Filled 2021-12-04 (×11): qty 0.3

## 2021-12-04 MED ORDER — HYDROMORPHONE HCL 1 MG/ML IJ SOLN
1.0000 mg | Freq: Once | INTRAMUSCULAR | Status: AC
  Administered 2021-12-04: 1 mg via INTRAVENOUS
  Filled 2021-12-04: qty 1

## 2021-12-04 MED ORDER — ACETAMINOPHEN 500 MG PO TABS: 1000.0000 mg | ORAL_TABLET | Freq: Four times a day (QID) | ORAL | Status: DC

## 2021-12-04 MED ORDER — LORAZEPAM 2 MG/ML IJ SOLN
1.0000 mg | Freq: Once | INTRAMUSCULAR | Status: AC
  Administered 2021-12-04: 1 mg via INTRAVENOUS
  Filled 2021-12-04: qty 1

## 2021-12-04 MED ORDER — KETOROLAC TROMETHAMINE 15 MG/ML IJ SOLN
30.0000 mg | Freq: Four times a day (QID) | INTRAMUSCULAR | Status: DC
  Administered 2021-12-04 – 2021-12-08 (×14): 30 mg via INTRAVENOUS
  Filled 2021-12-04 (×13): qty 2

## 2021-12-04 MED ORDER — METHOCARBAMOL 500 MG PO TABS
1000.0000 mg | ORAL_TABLET | Freq: Three times a day (TID) | ORAL | Status: DC
  Administered 2021-12-04 – 2021-12-10 (×17): 1000 mg via ORAL
  Filled 2021-12-04 (×18): qty 2

## 2021-12-04 MED ORDER — ONDANSETRON 4 MG PO TBDP
4.0000 mg | ORAL_TABLET | Freq: Four times a day (QID) | ORAL | Status: DC | PRN
  Administered 2021-12-08: 4 mg via ORAL
  Filled 2021-12-04: qty 1
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
## 2021-12-05 ENCOUNTER — Inpatient Hospital Stay (HOSPITAL_COMMUNITY): Payer: Self-pay

## 2021-12-05 ENCOUNTER — Encounter (HOSPITAL_COMMUNITY): Payer: Self-pay

## 2021-12-05 LAB — URINALYSIS, ROUTINE W REFLEX MICROSCOPIC
Bacteria, UA: NONE SEEN
Bilirubin Urine: NEGATIVE
Glucose, UA: NEGATIVE mg/dL
Ketones, ur: NEGATIVE mg/dL
Leukocytes,Ua: NEGATIVE
Nitrite: NEGATIVE
Protein, ur: 30 mg/dL — AB
Specific Gravity, Urine: >1.046 — ABNORMAL HIGH (ref 1.005–1.030)
pH: 6 (ref 5.0–8.0)

## 2021-12-05 LAB — HIV ANTIBODY (ROUTINE TESTING W REFLEX): HIV Screen 4th Generation wRfx: NONREACTIVE

## 2021-12-05 MED ORDER — ACETAMINOPHEN 500 MG PO TABS
1000.0000 mg | ORAL_TABLET | Freq: Once | ORAL | Status: AC
  Administered 2021-12-06: 1000 mg via ORAL
  Filled 2021-12-05 (×2): qty 2
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
## 2021-12-05 NOTE — ED Notes (Signed)
Pt at bedside working with patient 

## 2021-12-06 ENCOUNTER — Inpatient Hospital Stay (HOSPITAL_COMMUNITY): Payer: Self-pay

## 2021-12-06 ENCOUNTER — Inpatient Hospital Stay (HOSPITAL_COMMUNITY): Payer: Self-pay | Admitting: Anesthesiology

## 2021-12-06 ENCOUNTER — Other Ambulatory Visit: Payer: Self-pay

## 2021-12-06 ENCOUNTER — Encounter (HOSPITAL_COMMUNITY): Admission: EM | Disposition: A | Discharge status: Home Health | Payer: Self-pay | Source: Home / Self Care

## 2021-12-06 ENCOUNTER — Encounter (HOSPITAL_COMMUNITY): Payer: Self-pay

## 2021-12-06 DIAGNOSIS — J45909 Unspecified asthma, uncomplicated: Secondary | ICD-10-CM

## 2021-12-06 DIAGNOSIS — S3993XA Unspecified injury of pelvis, initial encounter: Secondary | ICD-10-CM

## 2021-12-06 DIAGNOSIS — I10 Essential (primary) hypertension: Secondary | ICD-10-CM

## 2021-12-06 DIAGNOSIS — F17200 Nicotine dependence, unspecified, uncomplicated: Secondary | ICD-10-CM

## 2021-12-06 HISTORY — PX: ORIF PELVIC FRACTURE WITH PERCUTANEOUS SCREWS: SHX6800

## 2021-12-06 LAB — MRSA NEXT GEN BY PCR, NASAL: MRSA by PCR Next Gen: NOT DETECTED

## 2021-12-06 SURGERY — CLOSED REDUCTION, PELVIS, WITH PERCUTANEOUS FIXATION
Anesthesia: General | Laterality: Left

## 2021-12-06 MED ORDER — METOCLOPRAMIDE HCL 5 MG/ML IJ SOLN: 5.0000 mg | Freq: Three times a day (TID) | INTRAMUSCULAR | Status: DC | PRN

## 2021-12-06 MED ORDER — DEXAMETHASONE SODIUM PHOSPHATE 10 MG/ML IJ SOLN
INTRAMUSCULAR | Status: DC | PRN
  Administered 2021-12-06: 10 mg via INTRAVENOUS

## 2021-12-06 MED ORDER — CHLORHEXIDINE GLUCONATE 0.12 % MT SOLN
OROMUCOSAL | Status: AC
  Administered 2021-12-06: 15 mL via OROMUCOSAL
  Filled 2021-12-06: qty 15

## 2021-12-06 MED ORDER — FENTANYL CITRATE (PF) 250 MCG/5ML IJ SOLN
INTRAMUSCULAR | Status: AC
  Filled 2021-12-06: qty 5

## 2021-12-06 MED ORDER — ONDANSETRON HCL 4 MG/2ML IJ SOLN: 4.0000 mg | Freq: Once | INTRAMUSCULAR | Status: DC | PRN

## 2021-12-06 MED ORDER — CEFAZOLIN SODIUM-DEXTROSE 2-4 GM/100ML-% IV SOLN
2.0000 g | Freq: Three times a day (TID) | INTRAVENOUS | Status: AC
  Administered 2021-12-06 – 2021-12-07 (×3): 2 g via INTRAVENOUS
  Filled 2021-12-06 (×3): qty 100

## 2021-12-06 MED ORDER — POLYETHYLENE GLYCOL 3350 17 G PO PACK
17.0000 g | PACK | Freq: Every day | ORAL | Status: DC | PRN
  Administered 2021-12-07: 17 g via ORAL
  Filled 2021-12-06 (×2): qty 1

## 2021-12-06 MED ORDER — HYDROMORPHONE HCL 1 MG/ML IJ SOLN
INTRAMUSCULAR | Status: AC
  Filled 2021-12-06: qty 1

## 2021-12-06 MED ORDER — HYDROMORPHONE HCL 1 MG/ML IJ SOLN: 0.2500 mg | INTRAMUSCULAR | Status: DC | PRN

## 2021-12-06 MED ORDER — HYDROMORPHONE HCL 1 MG/ML IJ SOLN
INTRAMUSCULAR | Status: DC | PRN
  Administered 2021-12-06: 1 mg via INTRAVENOUS

## 2021-12-06 MED ORDER — ONDANSETRON HCL 4 MG/2ML IJ SOLN
INTRAMUSCULAR | Status: DC | PRN
  Administered 2021-12-06: 4 mg via INTRAVENOUS

## 2021-12-06 MED ORDER — ROCURONIUM BROMIDE 10 MG/ML (PF) SYRINGE
PREFILLED_SYRINGE | INTRAVENOUS | Status: DC | PRN
  Administered 2021-12-06: 70 mg via INTRAVENOUS

## 2021-12-06 MED ORDER — ORAL CARE MOUTH RINSE: 15.0000 mL | Freq: Once | OROMUCOSAL | Status: AC

## 2021-12-06 MED ORDER — MIDAZOLAM HCL 2 MG/2ML IJ SOLN
INTRAMUSCULAR | Status: AC
  Filled 2021-12-06: qty 2

## 2021-12-06 MED ORDER — CHLORHEXIDINE GLUCONATE 0.12 % MT SOLN: 15.0000 mL | Freq: Once | OROMUCOSAL | Status: AC

## 2021-12-06 MED ORDER — SUGAMMADEX SODIUM 200 MG/2ML IV SOLN
INTRAVENOUS | Status: DC | PRN
  Administered 2021-12-06 (×2): 200 mg via INTRAVENOUS

## 2021-12-06 MED ORDER — LIDOCAINE 2% (20 MG/ML) 5 ML SYRINGE
INTRAMUSCULAR | Status: DC | PRN
  Administered 2021-12-06: 100 mg via INTRAVENOUS

## 2021-12-06 MED ORDER — MIDAZOLAM HCL 2 MG/2ML IJ SOLN
INTRAMUSCULAR | Status: DC | PRN
  Administered 2021-12-06: 2 mg via INTRAVENOUS

## 2021-12-06 MED ORDER — FENTANYL CITRATE (PF) 250 MCG/5ML IJ SOLN
INTRAMUSCULAR | Status: DC | PRN
  Administered 2021-12-06 (×2): 50 ug via INTRAVENOUS
  Administered 2021-12-06: 100 ug via INTRAVENOUS
  Administered 2021-12-06: 50 ug via INTRAVENOUS

## 2021-12-06 MED ORDER — PROPOFOL 10 MG/ML IV BOLUS
INTRAVENOUS | Status: DC | PRN
  Administered 2021-12-06: 200 mg via INTRAVENOUS

## 2021-12-06 MED ORDER — AMISULPRIDE (ANTIEMETIC) 5 MG/2ML IV SOLN: 10.0000 mg | Freq: Once | INTRAVENOUS | Status: DC | PRN

## 2021-12-06 MED ORDER — OXYCODONE HCL 5 MG/5ML PO SOLN: 5.0000 mg | Freq: Once | ORAL | Status: DC | PRN

## 2021-12-06 MED ORDER — METOCLOPRAMIDE HCL 5 MG PO TABS: 5.0000 mg | ORAL_TABLET | Freq: Three times a day (TID) | ORAL | Status: DC | PRN

## 2021-12-06 MED ORDER — LACTATED RINGERS IV SOLN: INTRAVENOUS | Status: DC

## 2021-12-06 MED ORDER — CEFAZOLIN SODIUM-DEXTROSE 2-4 GM/100ML-% IV SOLN
2.0000 g | INTRAVENOUS | Status: AC
  Administered 2021-12-06: 2 g via INTRAVENOUS
  Filled 2021-12-06: qty 100

## 2021-12-06 MED ORDER — OXYCODONE HCL 5 MG PO TABS: 5.0000 mg | ORAL_TABLET | Freq: Once | ORAL | Status: DC | PRN

## 2021-12-06 MED ORDER — PROPOFOL 10 MG/ML IV BOLUS
INTRAVENOUS | Status: AC
  Filled 2021-12-06: qty 40

## 2021-12-06 SURGICAL SUPPLY — 48 items
ADH SKN CLS APL DERMABOND .7 (GAUZE/BANDAGES/DRESSINGS) ×1
APL PRP STRL LF DISP 70% ISPRP (MISCELLANEOUS) ×1
BAG COUNTER SPONGE SURGICOUNT (BAG) ×1 IMPLANT
BAG SPNG CNTER NS LX DISP (BAG) ×1
BIT DRILL CANN 4.5MM (BIT) IMPLANT
BLADE CLIPPER SURG (BLADE) IMPLANT
BLADE SURG 11 STRL SS (BLADE) ×1 IMPLANT
CHLORAPREP W/TINT 26 (MISCELLANEOUS) ×1 IMPLANT
DERMABOND ADVANCED .7 DNX12 (GAUZE/BANDAGES/DRESSINGS) IMPLANT
DRAPE C-ARM 42X72 X-RAY (DRAPES) ×1 IMPLANT
DRAPE C-ARMOR (DRAPES) ×1 IMPLANT
DRAPE HALF SHEET 40X57 (DRAPES) ×1 IMPLANT
DRAPE INCISE IOBAN 66X45 STRL (DRAPES) ×1 IMPLANT
DRAPE SURG 17X23 STRL (DRAPES) ×6 IMPLANT
DRAPE U-SHAPE 47X51 STRL (DRAPES) ×1 IMPLANT
DRESSING MEPILEX FLEX 4X4 (GAUZE/BANDAGES/DRESSINGS) IMPLANT
DRILL BIT CANN 4.5MM (BIT) ×1
DRSG MEPILEX BORDER 4X4 (GAUZE/BANDAGES/DRESSINGS) IMPLANT
DRSG MEPILEX BORDER 4X8 (GAUZE/BANDAGES/DRESSINGS) IMPLANT
DRSG MEPILEX FLEX 4X4 (GAUZE/BANDAGES/DRESSINGS) ×1
ELECT REM PT RETURN 9FT ADLT (ELECTROSURGICAL) ×1
ELECTRODE REM PT RTRN 9FT ADLT (ELECTROSURGICAL) ×1 IMPLANT
GLOVE BIO SURGEON STRL SZ 6.5 (GLOVE) ×3 IMPLANT
GLOVE BIO SURGEON STRL SZ7.5 (GLOVE) ×4 IMPLANT
GLOVE BIOGEL PI IND STRL 6.5 (GLOVE) ×1 IMPLANT
GLOVE BIOGEL PI IND STRL 7.5 (GLOVE) ×1 IMPLANT
GOWN STRL REUS W/ TWL LRG LVL3 (GOWN DISPOSABLE) ×2 IMPLANT
GOWN STRL REUS W/TWL LRG LVL3 (GOWN DISPOSABLE) ×2
GUIDEWIRE 2.0MM (WIRE) IMPLANT
GUIDEWIRE 2.8 THREAD 450 L ST (WIRE) IMPLANT
KIT BASIN OR (CUSTOM PROCEDURE TRAY) ×1 IMPLANT
KIT TURNOVER KIT B (KITS) ×1 IMPLANT
MANIFOLD NEPTUNE II (INSTRUMENTS) ×1 IMPLANT
NS IRRIG 1000ML POUR BTL (IV SOLUTION) ×1 IMPLANT
PACK TOTAL JOINT (CUSTOM PROCEDURE TRAY) ×1 IMPLANT
PACK UNIVERSAL I (CUSTOM PROCEDURE TRAY) ×1 IMPLANT
PAD ARMBOARD 7.5X6 YLW CONV (MISCELLANEOUS) ×2 IMPLANT
SCREW CANN LT 7.5X80 (Screw) IMPLANT
SPONGE T-LAP 18X18 ~~LOC~~+RFID (SPONGE) IMPLANT
STAPLER VISISTAT 35W (STAPLE) ×1 IMPLANT
SUCTION FRAZIER HANDLE 10FR (MISCELLANEOUS) ×1
SUCTION TUBE FRAZIER 10FR DISP (MISCELLANEOUS) ×1 IMPLANT
SUT MNCRL AB 3-0 PS2 18 (SUTURE) ×1 IMPLANT
SUT MNCRL AB 3-0 PS2 27 (SUTURE) IMPLANT
SUT MON AB 2-0 CT1 36 (SUTURE) ×1 IMPLANT
TRAY FOLEY MTR SLVR 16FR STAT (SET/KITS/TRAYS/PACK) IMPLANT
WASHER CANN FOR SCREW 7.5 (Washer) IMPLANT
WATER STERILE IRR 1000ML POUR (IV SOLUTION) ×1 IMPLANT

## 2021-12-06 NOTE — Anesthesia Procedure Notes (Signed)
Procedure Name: Intubation Date/Time: 12/06/2021 9:03 AM  Performed by: Darletta Moll, CRNAPre-anesthesia Checklist: Patient identified, Emergency Drugs available, Suction available and Patient being monitored Patient Re-evaluated:Patient Re-evaluated prior to induction Oxygen Delivery Method: Circle system utilized Preoxygenation: Pre-oxygenation with 100% oxygen Induction Type: IV induction Ventilation: Mask ventilation without difficulty Laryngoscope Size: Mac and 3 Grade View: Grade I Tube type: Oral Tube size: 7.0 mm Number of attempts: 1 Airway Equipment and Method: Stylet and Oral airway Placement Confirmation: ETT inserted through vocal cords under direct vision, positive ETCO2 and breath sounds checked- equal and bilateral Secured at: 22 cm Tube secured with: Tape Dental Injury: Teeth and Oropharynx as per pre-operative assessment

## 2021-12-06 NOTE — Interval H&P Note (Signed)
History and Physical Interval Note:  12/06/2021 8:08 AM  Paula Hawkins  has presented today for surgery, with the diagnosis of Left pelvic fracture.  The various methods of treatment have been discussed with the patient and family. After consideration of risks, benefits and other options for treatment, the patient has consented to  Procedure(s): ORIF PELVIC FRACTURE WITH PERCUTANEOUS SCREWS (Left) as a surgical intervention.  The patient's history has been reviewed, patient examined, no change in status, stable for surgery.  I have reviewed the patient's chart and labs.  Questions were answered to the patient's satisfaction.     Lennette Bihari P Chrisoula Zegarra
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
## 2021-12-06 NOTE — Progress Notes (Signed)
OT Cancellation Note  Patient Details Name: ELANA JIAN MRN: [id number] DOB: 1995/09/02   Cancelled Treatment:    Reason Eval/Treat Not Completed: Patient at procedure or test/ unavailable (pt at OR for percutaneous fixation of pelvis.)  [place], OTR/L [facility] Re[facility] [phone] Office [phone]    Almon Register 12/06/2021, 9:49 AM
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
## 2021-12-06 NOTE — Discharge Instructions (Signed)
Orthopaedic Trauma Service Discharge Instructions   General Discharge Instructions  WEIGHT BEARING STATUS: touchdown weightbearing left lower extremity  RANGE OF MOTION/ACTIVITY: Ok for hip and knee range of motion as tolerated  Wound Care: Incisions can be left open to air if there is no drainage. If incision continues to have drainage, follow wound care instructions below. Okay to shower if no drainage from incisions.  DVT/PE prophylaxis: Aspirin 325 mg daily x 30 days  Diet: as you were eating previously.  Can use over the counter stool softeners and bowel preparations, such as Miralax, to help with bowel movements.  Narcotics can be constipating.  Be sure to drink plenty of fluids  PAIN MEDICATION USE AND EXPECTATIONS  You have likely been given narcotic medications to help control your pain.  After a traumatic event that results in an fracture (broken bone) with or without surgery, it is ok to use narcotic pain medications to help control one's pain.  We understand that everyone responds to pain differently and each individual patient will be evaluated on a regular basis for the continued need for narcotic medications. Ideally, narcotic medication use should last no more than 6-8 weeks (coinciding with fracture healing).   As a patient it is your responsibility as well to monitor narcotic medication use and report the amount and frequency you use these medications when you come to your office visit.   We would also advise that if you are using narcotic medications, you should take a dose prior to therapy to maximize you participation.  IF YOU ARE ON NARCOTIC MEDICATIONS IT IS NOT PERMISSIBLE TO OPERATE A MOTOR VEHICLE (MOTORCYCLE/CAR/TRUCK/MOPED) OR HEAVY MACHINERY DO NOT MIX NARCOTICS WITH OTHER CNS (CENTRAL NERVOUS SYSTEM) DEPRESSANTS SUCH AS ALCOHOL   STOP SMOKING OR USING NICOTINE PRODUCTS!!!!  As discussed nicotine severely impairs your body's ability to heal surgical and  traumatic wounds but also impairs bone healing.  Wounds and bone heal by forming microscopic blood vessels (angiogenesis) and nicotine is a vasoconstrictor (essentially, shrinks blood vessels).  Therefore, if vasoconstriction occurs to these microscopic blood vessels they essentially disappear and are unable to deliver necessary nutrients to the healing tissue.  This is one modifiable factor that you can do to dramatically increase your chances of healing your injury.    (This means no smoking, no nicotine gum, patches, etc)  DO NOT USE NONSTEROIDAL ANTI-INFLAMMATORY DRUGS (NSAID'S)  Using products such as Advil (ibuprofen), Aleve (naproxen), Motrin (ibuprofen) for additional pain control during fracture healing can delay and/or prevent the healing response.  If you would like to take over the counter (OTC) medication, Tylenol (acetaminophen) is ok.  However, some narcotic medications that are given for pain control contain acetaminophen as well. Therefore, you should not exceed more than 4000 mg of tylenol in a day if you do not have liver disease.  Also note that there are may OTC medicines, such as cold medicines and allergy medicines that my contain tylenol as well.  If you have any questions about medications and/or interactions please ask your doctor/PA or your pharmacist.      ICE AND ELEVATE INJURED/OPERATIVE EXTREMITY  Using ice and elevating the injured extremity above your heart can help with swelling and pain control.  Icing in a pulsatile fashion, such as 20 minutes on and 20 minutes off, can be followed.    Do not place ice directly on skin. Make sure there is a barrier between to skin and the ice pack.    Using  frozen items such as frozen peas works well as the conform nicely to the are that needs to be iced.  USE AN ACE WRAP OR TED HOSE FOR SWELLING CONTROL  In addition to icing and elevation, Ace wraps or TED hose are used to help limit and resolve swelling.  It is recommended to use  Ace wraps or TED hose until you are informed to stop.    When using Ace Wraps start the wrapping distally (farthest away from the body) and wrap proximally (closer to the body)   Example: If you had surgery on your leg or thing and you do not have a splint on, start the ace wrap at the toes and work your way up to the thigh        If you had surgery on your upper extremity and do not have a splint on, start the ace wrap at your fingers and work your way up to the upper arm   [place] REFILLS OR WITH ANY QUESTIONS/CONCERNS: [phone]   VISIT OUR WEBSITE FOR ADDITIONAL INFORMATION: [email or web address]    Discharge Wound Care Instructions  Do NOT apply any ointments, solutions or lotions to pin sites or surgical wounds.  These prevent needed drainage and even though solutions like hydrogen peroxide kill bacteria, they also damage cells lining the pin sites that help fight infection.  Applying lotions or ointments can keep the wounds moist and can cause them to breakdown and open up as well. This can increase the risk for infection. When in doubt call the office.  If any drainage is noted, use one layer of adaptic or Mepitel, then gauze, Kerlix, and an ace wrap. - These dressing supplies should be available at local medical supply stores [facility], [facility], etc) as well as Management consultant ([facility], [facility], [place], etc)  Once the incision is completely dry and without drainage, it may be left open to air out.  Showering may begin 36-48 hours later.  Cleaning gently with soap and water.  Traumatic wounds should be dressed daily as well.    One layer of adaptic, gauze, Kerlix, then ace wrap.  The adaptic can be discontinued once the draining has ceased        [place]:  Address: [address], [place], [place] [address] Hours:  Closes 5?PM Phone: [phone]  [facility]:  Located in: [facility] Address: [address], [place], [place] [address] Areas served:  [employer]:  Closes 8?PM  Phone: [phone]  Trauma Focused Therapist(s):   Marnette Burgess [place] [id number] [place], [place] [address]  [email or web address]  Kennis Carina [facility] [place], [place] [address]  [email or web address]  [place] Licensed Clinical Mental Health Counselor, [phone] [facility], [facility], [place] [phone] and [phone] JFHLKT GYBWLSLHTD [place], [place] [id number] [place], [place] [address] [place], [place]  [phone] Tra[facility] Support Groups [facility] [place], [place]   [place] (Emotional Abuse Survivor) [address] [phone] [facility] [place],  [address]  [phone]

## 2021-12-06 NOTE — Progress Notes (Signed)
PT Cancellation Note  Patient Details Name: Paula Hawkins MRN: [id number] DOB: October 27, 1995   Cancelled Treatment:    Reason Eval/Treat Not Completed: Pain limiting ability to participate; noted per chart pt nauseated and in pain post procedure.  Per OT pt was hoping for same therapists as anxious on evaluation.  Will attempt again tomorrow.    Reginia Naas 12/06/2021, 2:16 PM Magda Kiel, PT Acute Rehabilitation Services Office:[phone] 12/06/2021
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
## 2021-12-06 NOTE — Transfer of Care (Signed)
Immediate Anesthesia Transfer of Care Note  Patient: Paula Hawkins  Procedure(s) Performed: ORIF PELVIC FRACTURE WITH PERCUTANEOUS SCREWS (Left)  Patient Location: PACU  Anesthesia Type:General  Level of Consciousness: drowsy and patient cooperative  Airway & Oxygen Therapy: Patient Spontanous Breathing  Post-op Assessment: Report given to RN, Post -op Vital signs reviewed and stable, and Patient moving all extremities X 4  Post vital signs: Reviewed and stable  Last Vitals:  Vitals Value Taken Time  BP 138/84 12/06/21 1000  Temp    Pulse 104 12/06/21 1002  Resp 15 12/06/21 1002  SpO2 91 % 12/06/21 1002  Vitals shown include unvalidated device data.  Last Pain:  Vitals:   12/06/21 0813  TempSrc:   PainSc: 8       Patients Stated Pain Goal: 3 (67/59/16 3846)  Complications: No notable events documented.

## 2021-12-06 NOTE — Anesthesia Postprocedure Evaluation (Signed)
Anesthesia Post Note  Patient: Paula Hawkins  Procedure(s) Performed: ORIF PELVIC FRACTURE WITH PERCUTANEOUS SCREWS (Left)     Patient location during evaluation: PACU Anesthesia Type: General Level of consciousness: awake and alert Pain management: pain level controlled Vital Signs Assessment: post-procedure vital signs reviewed and stable Respiratory status: spontaneous breathing, nonlabored ventilation and respiratory function stable Cardiovascular status: blood pressure returned to baseline and stable Postop Assessment: no apparent nausea or vomiting Anesthetic complications: no   No notable events documented.  Last Vitals:  Vitals:   12/06/21 1100 12/06/21 1458  BP: 134/82 (!) 144/86  Pulse: 90 83  Resp: 19   Temp: 36.6 C 36.6 C  SpO2: 98% 100%    Last Pain:  Vitals:   12/06/21 1458  TempSrc: Oral  PainSc:                  Lidia Collum

## 2021-12-07 ENCOUNTER — Encounter (HOSPITAL_COMMUNITY): Payer: Self-pay | Admitting: Student

## 2021-12-07 DIAGNOSIS — F4311 Post-traumatic stress disorder, acute: Secondary | ICD-10-CM | POA: Diagnosis present

## 2021-12-07 LAB — BASIC METABOLIC PANEL
Anion gap: 8 (ref 5–15)
BUN: 7 mg/dL (ref 6–20)
CO2: 23 mmol/L (ref 22–32)
Calcium: 8.7 mg/dL — ABNORMAL LOW (ref 8.9–10.3)
Chloride: 107 mmol/L (ref 98–111)
Creatinine, Ser: 0.79 mg/dL (ref 0.44–1.00)
GFR, Estimated: >60 mL/min (ref >60)
Glucose, Bld: 114 mg/dL — ABNORMAL HIGH (ref 70–99)
Potassium: 3.8 mmol/L (ref 3.5–5.1)
Sodium: 138 mmol/L (ref 135–145)

## 2021-12-07 LAB — CBC
HCT: 30.3 % — ABNORMAL LOW (ref 36.0–46.0)
Hemoglobin: 10.9 g/dL — ABNORMAL LOW (ref 12.0–15.0)
MCH: 33.4 pg (ref 26.0–34.0)
MCHC: 36 g/dL (ref 30.0–36.0)
MCV: 92.9 fL (ref 80.0–100.0)
Platelets: 226 10*3/uL (ref 150–400)
RBC: 3.26 MIL/uL — ABNORMAL LOW (ref 3.87–5.11)
RDW: 12.3 % (ref 11.5–15.5)
WBC: 15.9 10*3/uL — ABNORMAL HIGH (ref 4.0–10.5)
nRBC: 0 % (ref 0.0–0.2)

## 2021-12-07 LAB — VITAMIN D 25 HYDROXY (VIT D DEFICIENCY, FRACTURES): Vit D, 25-Hydroxy: 7.19 ng/mL — ABNORMAL LOW (ref 30–100)

## 2021-12-07 MED ORDER — HYDROXYZINE HCL 25 MG PO TABS: 25.0000 mg | ORAL_TABLET | Freq: Three times a day (TID) | ORAL | Status: DC | PRN

## 2021-12-07 MED ORDER — HYDROXYZINE HCL 25 MG PO TABS
25.0000 mg | ORAL_TABLET | Freq: Three times a day (TID) | ORAL | Status: AC
  Administered 2021-12-07 – 2021-12-09 (×6): 25 mg via ORAL
  Filled 2021-12-07 (×6): qty 1

## 2021-12-07 MED ORDER — NICOTINE 14 MG/24HR TD PT24
14.0000 mg | MEDICATED_PATCH | Freq: Every day | TRANSDERMAL | Status: DC
  Administered 2021-12-07 – 2021-12-10 (×4): 14 mg via TRANSDERMAL
  Filled 2021-12-07 (×4): qty 1

## 2021-12-07 MED ORDER — VITAMIN D (ERGOCALCIFEROL) 1.25 MG (50000 UNIT) PO CAPS
50000.0000 [IU] | ORAL_CAPSULE | ORAL | Status: DC
  Administered 2021-12-07: 50000 [IU] via ORAL
  Filled 2021-12-07 (×2): qty 1

## 2021-12-07 MED ORDER — MAGNESIUM CITRATE PO SOLN: 1.0000 | Freq: Once | ORAL | Status: DC | PRN
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
## 2021-12-07 NOTE — Progress Notes (Signed)
Mobility Specialist Progress Note   12/07/21 1800  Mobility  Activity Ambulated with assistance in hallway  Level of Assistance Moderate assist, patient does 50-74%  Assistive Device Front wheel walker  Distance Ambulated (ft) 50 ft  LLE Weight Bearing TWB  Activity Response Tolerated well  $Mobility charge 1 Mobility   Received pt in bed c/o general pain in LE's but agreeable. Able to get to EOB and standing w/ minA because of decreased pain tolerance in LLE. X2 seated breaks d/t fatigue but able to finish ambulation goal. Rolled back to room in chair and left w/ call bell in reach and other needs met.   Holland Falling Mobility Specialist Acute Rehab Office:  [phone]
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
## 2021-12-08 DIAGNOSIS — S32409S Unspecified fracture of unspecified acetabulum, sequela: Secondary | ICD-10-CM

## 2021-12-08 DIAGNOSIS — F4311 Post-traumatic stress disorder, acute: Secondary | ICD-10-CM

## 2021-12-08 DIAGNOSIS — F430 Acute stress reaction: Secondary | ICD-10-CM

## 2021-12-08 LAB — CBC
HCT: 28.4 % — ABNORMAL LOW (ref 36.0–46.0)
Hemoglobin: 9.9 g/dL — ABNORMAL LOW (ref 12.0–15.0)
MCH: 32.9 pg (ref 26.0–34.0)
MCHC: 34.9 g/dL (ref 30.0–36.0)
MCV: 94.4 fL (ref 80.0–100.0)
Platelets: 222 10*3/uL (ref 150–400)
RBC: 3.01 MIL/uL — ABNORMAL LOW (ref 3.87–5.11)
RDW: 12.7 % (ref 11.5–15.5)
WBC: 13.6 10*3/uL — ABNORMAL HIGH (ref 4.0–10.5)
nRBC: 0 % (ref 0.0–0.2)

## 2021-12-08 MED ORDER — HYDROXYZINE HCL 25 MG PO TABS
25.0000 mg | ORAL_TABLET | Freq: Three times a day (TID) | ORAL | Status: DC | PRN
  Administered 2021-12-10: 25 mg via ORAL
  Filled 2021-12-08: qty 1

## 2021-12-08 MED ORDER — MORPHINE SULFATE (PF) 2 MG/ML IV SOLN: 2.0000 mg | INTRAVENOUS | Status: DC | PRN

## 2021-12-08 MED ORDER — IBUPROFEN 200 MG PO TABS
800.0000 mg | ORAL_TABLET | Freq: Four times a day (QID) | ORAL | Status: DC
  Administered 2021-12-08 – 2021-12-10 (×9): 800 mg via ORAL
  Filled 2021-12-08 (×9): qty 4

## 2021-12-08 NOTE — Plan of Care (Signed)
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
## 2021-12-08 NOTE — Plan of Care (Signed)
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
## 2021-12-09 MED ORDER — OXYCODONE HCL 5 MG PO TABS
2.5000 mg | ORAL_TABLET | ORAL | Status: DC | PRN
  Administered 2021-12-09 – 2021-12-10 (×2): 2.5 mg via ORAL
  Filled 2021-12-09 (×3): qty 1

## 2021-12-09 MED ORDER — TRAMADOL HCL 50 MG PO TABS
50.0000 mg | ORAL_TABLET | Freq: Four times a day (QID) | ORAL | Status: DC
  Administered 2021-12-09: 50 mg via ORAL
  Filled 2021-12-09: qty 1

## 2021-12-09 MED ORDER — TRAMADOL HCL 50 MG PO TABS
50.0000 mg | ORAL_TABLET | Freq: Four times a day (QID) | ORAL | Status: DC | PRN
  Administered 2021-12-09: 100 mg via ORAL
  Administered 2021-12-10: 50 mg via ORAL
  Filled 2021-12-09: qty 2
  Filled 2021-12-09: qty 1

## 2021-12-09 NOTE — TOC Transition Note (Addendum)
Transition of Care [facility]) - CM/SW Discharge Note   Patient Details  Name: Paula Hawkins MRN: [id number] Date of Birth: [birth year]  Transition of Care [facility]) CM/SW Contact:  Glennon Mac, RN Phone Number: 12/09/2021, 4:11 PM   Clinical Narrative:    PT recommending HH follow up; patient is uninsured, but agreeable to [facility] services.  Referral to [facility] for HHPT follow up.  Added tub bench to DME orders, per recommendation. [employer] to deliver to bedside prior to dc.  RW and BSC have already been delivered to room.  [place] [address] [address], and notified [facility] agency.      Final next level of care: Home w Home Health Services Barriers to Discharge: Continued Medical Work up   Patient Goals and CMS Choice   CMS Medicare.gov Compare Post Acute Care list provided to:: Patient Choice offered to / list presented to : Patient                        Discharge Plan and Services   Discharge Planning Services: CM Consult Post Acute Care Choice: Home Health          DME Arranged: Bedside commode, Walker rolling, Tub bench DME Agency: [employer] Date DME Agency Contacted: 12/08/21 Time DME Agency Contacted: 1500 Representative spoke with at DME Agency: Mayra Reel HH Arranged: PT HH Agency: [facility] Date [facility] Agency Contacted: 12/09/21   Representative spoke with at [facility] Agency: Lorenza Chick  Social Determinants of Health (SDOH) Interventions     Readmission Risk Interventions     No data to display         Quintella Baton, RN, BSN  Trauma/Neuro ICU Case Manager [phone]

## 2021-12-09 NOTE — Plan of Care (Signed)
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
## 2021-12-09 NOTE — Progress Notes (Addendum)
Mobility Specialist Progress Note   12/09/21 1300  Mobility  Activity Ambulated with assistance in hallway;Transferred from bed to chair  Level of Assistance Standby assist, set-up cues, supervision of patient - no hands on  Assistive Device Front wheel walker  Distance Ambulated (ft) 150 ft  Range of Motion/Exercises Active;All extremities  LLE Weight Bearing NWB  Activity Response Tolerated well   Patient received in supine, eager to participate. Progressing well with mobility as she was able to extend distance from prior sessions. Better pain tolerance despite weaning herself off of pain medication. Completed education on relaxation breathing techniques to help ease pain. Was independent to EOB from supine to sit. Stood and ambulated "hop" with slow gait. Needed cues for mechanics and staying proximal to RW. Also required seated rest recovery x2 secondary to UE fatigue. Returned to room without complaint or incident. Was left in recliner with all needs met, call bell in reach.   [place] Knute Mazzuca, [place], [place]  Office: [phone]

## 2021-12-09 NOTE — TOC CAGE-AID Note (Signed)
Transition of Care [facility]) - CAGE-AID Screening   Patient Details  Name: Paula Hawkins MRN: [id number] Date of Birth: 07-15-1995  Transition of Care (TOC) CM/SW Contact:    Katha Hamming, RN Phone [phone] 12/09/2021, 12:47 AM   Clinical Narrative:  No hx alcohol or drug use, no resources indicated  CAGE-AID Screening:    Have You Ever Felt You Ought to Cut Down on Your Drinking or Drug Use?: No Have People Annoyed You By Critizing Your Drinking Or Drug Use?: No Have You Felt Bad Or Guilty About Your Drinking Or Drug Use?: No Have You Ever Had a Drink or Used Drugs First Thing In The Morning to Steady Your Nerves or to Get Rid of a Hangover?: No CAGE-AID Score: 0  Substance Abuse Education Offered: No

## 2021-12-10 ENCOUNTER — Other Ambulatory Visit (HOSPITAL_COMMUNITY): Payer: Self-pay

## 2021-12-10 MED ORDER — POLYETHYLENE GLYCOL 3350 17 G PO PACK: 17.0000 g | PACK | Freq: Every day | ORAL | Status: DC | PRN

## 2021-12-10 MED ORDER — TRAMADOL HCL 50 MG PO TABS
50.0000 mg | ORAL_TABLET | Freq: Four times a day (QID) | ORAL | 0 refills | Status: DC | PRN
  Filled 2021-12-10: qty 30, 4d supply, fill #0

## 2021-12-10 MED ORDER — VITAMIN D (ERGOCALCIFEROL) 1.25 MG (50000 UNIT) PO CAPS
50000.0000 [IU] | ORAL_CAPSULE | ORAL | 0 refills | Status: DC
  Filled 2021-12-10: qty 5, 35d supply, fill #0

## 2021-12-10 MED ORDER — HYDROXYZINE HCL 25 MG PO TABS
25.0000 mg | ORAL_TABLET | Freq: Three times a day (TID) | ORAL | 0 refills | Status: DC | PRN
  Filled 2021-12-10: qty 30, 10d supply, fill #0

## 2021-12-10 MED ORDER — METHOCARBAMOL 500 MG PO TABS
1000.0000 mg | ORAL_TABLET | Freq: Three times a day (TID) | ORAL | 0 refills | Status: DC
  Filled 2021-12-10: qty 120, 20d supply, fill #0

## 2021-12-10 MED ORDER — ACETAMINOPHEN 500 MG PO TABS: 1000.0000 mg | ORAL_TABLET | Freq: Four times a day (QID) | ORAL | Status: DC | PRN

## 2021-12-10 MED ORDER — IBUPROFEN 800 MG PO TABS
800.0000 mg | ORAL_TABLET | Freq: Four times a day (QID) | ORAL | 0 refills | Status: DC
  Filled 2021-12-10: qty 30, 8d supply, fill #0

## 2021-12-10 MED ORDER — OXYCODONE HCL 5 MG PO TABS
2.5000 mg | ORAL_TABLET | ORAL | 0 refills | Status: DC | PRN
  Filled 2021-12-10: qty 10, 4d supply, fill #0

## 2021-12-10 NOTE — Plan of Care (Signed)

## 2021-12-16 ENCOUNTER — Other Ambulatory Visit (HOSPITAL_COMMUNITY): Payer: Self-pay

## 2021-12-17 ENCOUNTER — Other Ambulatory Visit (HOSPITAL_COMMUNITY): Payer: Self-pay

## 2021-12-20 ENCOUNTER — Emergency Department (HOSPITAL_BASED_OUTPATIENT_CLINIC_OR_DEPARTMENT_OTHER)
Admission: EM | Admit: 2021-12-20 | Discharge: 2021-12-21 | Disposition: A | Discharge status: Home / Self Care | Payer: Self-pay | Attending: Emergency Medicine | Admitting: Emergency Medicine

## 2021-12-20 ENCOUNTER — Encounter (HOSPITAL_BASED_OUTPATIENT_CLINIC_OR_DEPARTMENT_OTHER): Payer: Self-pay

## 2021-12-20 ENCOUNTER — Emergency Department (HOSPITAL_BASED_OUTPATIENT_CLINIC_OR_DEPARTMENT_OTHER): Payer: Self-pay | Admitting: Radiology

## 2021-12-20 ENCOUNTER — Other Ambulatory Visit: Payer: Self-pay

## 2021-12-20 DIAGNOSIS — Z91040 Latex allergy status: Secondary | ICD-10-CM | POA: Insufficient documentation

## 2021-12-20 DIAGNOSIS — Z4802 Encounter for removal of sutures: Secondary | ICD-10-CM

## 2021-12-20 DIAGNOSIS — Y92009 Unspecified place in unspecified non-institutional (private) residence as the place of occurrence of the external cause: Secondary | ICD-10-CM

## 2021-12-20 DIAGNOSIS — S7002XA Contusion of left hip, initial encounter: Secondary | ICD-10-CM

## 2021-12-20 DIAGNOSIS — Y92410 Unspecified street and highway as the place of occurrence of the external cause: Secondary | ICD-10-CM | POA: Insufficient documentation

## 2021-12-20 DIAGNOSIS — M25552 Pain in left hip: Secondary | ICD-10-CM | POA: Insufficient documentation

## 2021-12-20 DIAGNOSIS — S91012D Laceration without foreign body, left ankle, subsequent encounter: Secondary | ICD-10-CM | POA: Insufficient documentation

## 2021-12-20 DIAGNOSIS — W19XXXD Unspecified fall, subsequent encounter: Secondary | ICD-10-CM | POA: Insufficient documentation

## 2021-12-20 DIAGNOSIS — W19XXXA Unspecified fall, initial encounter: Secondary | ICD-10-CM
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
## 2021-12-20 NOTE — Discharge Instructions (Signed)
Apply ice for 30 minutes at a time, 4 times a day.  Continue to use your pain medication as needed.  Follow-up with your orthopedic physician, as scheduled.
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
## 2022-01-15 ENCOUNTER — Encounter (HOSPITAL_BASED_OUTPATIENT_CLINIC_OR_DEPARTMENT_OTHER): Payer: Self-pay | Admitting: Emergency Medicine

## 2022-01-15 ENCOUNTER — Other Ambulatory Visit: Payer: Self-pay

## 2022-01-15 ENCOUNTER — Emergency Department (HOSPITAL_BASED_OUTPATIENT_CLINIC_OR_DEPARTMENT_OTHER)
Admission: EM | Admit: 2022-01-15 | Discharge: 2022-01-15 | Disposition: A | Discharge status: Home / Self Care | Payer: Self-pay | Attending: Emergency Medicine | Admitting: Emergency Medicine

## 2022-01-15 DIAGNOSIS — Z91040 Latex allergy status: Secondary | ICD-10-CM | POA: Insufficient documentation

## 2022-01-15 DIAGNOSIS — R52 Pain, unspecified: Secondary | ICD-10-CM

## 2022-01-15 DIAGNOSIS — G8918 Other acute postprocedural pain: Secondary | ICD-10-CM | POA: Insufficient documentation

## 2022-01-15 DIAGNOSIS — Z4889 Encounter for other specified surgical aftercare: Secondary | ICD-10-CM | POA: Insufficient documentation

## 2022-01-15 LAB — BASIC METABOLIC PANEL
Anion gap: 9 (ref 5–15)
BUN: 7 mg/dL (ref 6–20)
CO2: 27 mmol/L (ref 22–32)
Calcium: 9.4 mg/dL (ref 8.9–10.3)
Chloride: 102 mmol/L (ref 98–111)
Creatinine, Ser: 0.8 mg/dL (ref 0.44–1.00)
GFR, Estimated: >60 mL/min (ref >60)
Glucose, Bld: 104 mg/dL — ABNORMAL HIGH (ref 70–99)
Potassium: 4 mmol/L (ref 3.5–5.1)
Sodium: 138 mmol/L (ref 135–145)

## 2022-01-15 LAB — CBC WITH DIFFERENTIAL/PLATELET
Abs Immature Granulocytes: 0.02 10*3/uL (ref 0.00–0.07)
Basophils Absolute: 0 10*3/uL (ref 0.0–0.1)
Basophils Relative: 0 %
Eosinophils Absolute: 0.1 10*3/uL (ref 0.0–0.5)
Eosinophils Relative: 1 %
HCT: 38.7 % (ref 36.0–46.0)
Hemoglobin: 12.9 g/dL (ref 12.0–15.0)
Immature Granulocytes: 0 %
Lymphocytes Relative: 35 %
Lymphs Abs: 3.1 10*3/uL (ref 0.7–4.0)
MCH: 32 pg (ref 26.0–34.0)
MCHC: 33.3 g/dL (ref 30.0–36.0)
MCV: 96 fL (ref 80.0–100.0)
Monocytes Absolute: 0.5 10*3/uL (ref 0.1–1.0)
Monocytes Relative: 5 %
Neutro Abs: 5.2 10*3/uL (ref 1.7–7.7)
Neutrophils Relative %: 59 %
Platelets: 307 10*3/uL (ref 150–400)
RBC: 4.03 MIL/uL (ref 3.87–5.11)
RDW: 12.8 % (ref 11.5–15.5)
WBC: 8.9 10*3/uL (ref 4.0–10.5)
nRBC: 0 % (ref 0.0–0.2)

## 2022-01-15 MED ORDER — DOXYCYCLINE HYCLATE 100 MG PO TABS
100.0000 mg | ORAL_TABLET | Freq: Once | ORAL | Status: AC
  Administered 2022-01-15: 100 mg via ORAL
  Filled 2022-01-15: qty 1

## 2022-01-15 MED ORDER — DOXYCYCLINE HYCLATE 100 MG PO CAPS: 100.0000 mg | ORAL_CAPSULE | Freq: Two times a day (BID) | ORAL | 0 refills | Status: DC

## 2022-01-15 NOTE — ED Provider Notes (Signed)
[facility] EMERGENCY DEPT Provider Note   CSN: [id number] Arrival date & time: 01/15/22  1139     History  Chief Complaint  Patient presents with   Wound Check    Paula Hawkins is a 26 y.o. female.  Patient presents for evaluation of increasing wound pain and drainage.  Patient had a surgical fixation of a left posterior pelvic fracture performed on 11/6 by Dr. Jena Gauss after an accident.  She has not yet returned to work.  She has overall been doing well but has had increased burning pain, like a bee sting, in the area with scant amount of drainage over the past 2 days.  She is ambulatory and moving well.  She is hopeful that she can go back to work.  No fevers, nausea or vomiting.  She is due to follow-up with orthopedics in 3 days.       Home Medications Prior to Admission medications   Medication Sig Start Date End Date Taking? Authorizing Provider  acetaminophen (TYLENOL) 500 MG tablet Take 2 tablets (1,000 mg total) by mouth every 6 (six) hours as needed for mild pain or fever. 12/10/21   Juliet Rude, PA-C  hydrOXYzine (ATARAX) 25 MG tablet Take 1 tablet (25 mg total) by mouth 3 (three) times daily as needed for anxiety. 12/10/21   Juliet Rude, PA-C  ibuprofen (ADVIL) 800 MG tablet Take 1 tablet (800 mg total) by mouth 4 (four) times daily. Take with food 12/10/21   Juliet Rude, PA-C  methocarbamol (ROBAXIN) 500 MG tablet Take 2 tablets (1,000 mg total) by mouth every 8 (eight) hours. 12/10/21   Juliet Rude, PA-C  oxyCODONE (OXY IR/ROXICODONE) 5 MG immediate release tablet Take 0.5 tablets (2.5 mg total) by mouth every 4 (four) hours as needed for breakthrough pain. 12/10/21   Juliet Rude, PA-C  polyethylene glycol (MIRALAX / GLYCOLAX) 17 g packet Take 17 g by mouth daily as needed for mild constipation. 12/10/21   Juliet Rude, PA-C  traMADol (ULTRAM) 50 MG tablet Take 1-2 tablets (50-100 mg total) by mouth every 6 (six) hours as  needed (50 mg for moderate pain, 100 mg for severe). 12/10/21   Juliet Rude, PA-C  Vitamin D, Ergocalciferol, (DRISDOL) 1.25 MG (50000 UNIT) CAPS capsule Take 1 capsule (50,000 Units total) by mouth every 7 (seven) days. 12/14/21   Juliet Rude, PA-C  topiramate (TOPAMAX) 50 MG tablet Take 1 tablet (50 mg total) by mouth at bedtime. Patient not taking: Reported on 01/05/2020 03/26/18 01/05/20  Elvina Sidle, MD      Allergies    Kiwi extract, Latex, and Pineapple    Review of Systems   Review of Systems  Physical Exam Updated Vital Signs BP (!) 150/95 (BP Location: Right Arm)   Pulse 96   Temp 98.9 F (37.2 C)   Resp 18   Ht 5\' 9"  (1.753 m)   Wt 119.3 kg   LMP 12/04/2021 (Exact Date)   SpO2 100%   BMI 38.84 kg/m   Physical Exam Vitals and nursing note reviewed.  Constitutional:      Appearance: She is well-developed.  HENT:     Head: Normocephalic and atraumatic.  Eyes:     Conjunctiva/sclera: Conjunctivae normal.  Pulmonary:     Effort: No respiratory distress.  Musculoskeletal:     Cervical back: Normal range of motion and neck supple.     Comments: Patient with healing surgical wound, left posterior pelvis.  No  significant surrounding erythema.  I cannot palpate any abscess.  I cannot express any significant drainage.  Patient with full active range of motion of her left hip without difficulty.  She is able to stand and bear weight without problems.  Skin:    General: Skin is warm and dry.  Neurological:     Mental Status: She is alert.     ED Results / Procedures / Treatments   Labs (all labs ordered are listed, but only abnormal results are displayed) Labs Reviewed  CBC WITH DIFFERENTIAL/PLATELET  BASIC METABOLIC PANEL    EKG None  Radiology No results found.  Procedures Procedures    Medications Ordered in ED Medications  doxycycline (VIBRA-TABS) tablet 100 mg (has no administration in time range)    ED Course/ Medical Decision  Making/ A&P    Patient seen and examined. History obtained directly from patient.  Briefly reviewed op note from November.  Labs/EKG: Ordered CBC, BMP  Imaging: None ordered.  Medications/Fluids: Ordered: PO doxycycline  Most recent vital signs reviewed and are as follows: BP (!) 150/95 (BP Location: Right Arm)   Pulse 96   Temp 98.9 F (37.2 C)   Resp 18   Ht 5\' 9"  (1.753 m)   Wt 119.3 kg   LMP 12/04/2021 (Exact Date)   SpO2 100%   BMI 38.84 kg/m   Initial impression: Postoperative wound pain, cannot rule out mild infection.  Will check lab work.  Patient's wound appears fairly well and she is moving well.  It is concerning however that she has had mild drainage with increasing pain, although not causing any dysfunction.  I think that starting her on doxycycline is reasonable.  If lab work is concerning with significantly elevated white blood cell count, would consider CT imaging to evaluate for deeper infection.  Otherwise I think that she can go home with oral antibiotics until her follow-up on Tuesday, December 19.  3:15 PM Reassessment performed. Patient appears stable.  Labs personally reviewed and interpreted including: CBC unremarkable with normal white blood cell count, BMP  Reviewed pertinent lab work and imaging with patient at bedside. Questions answered.   Most current vital signs reviewed and are as follows: BP (!) 150/95 (BP Location: Right Arm)   Pulse 96   Temp 98.9 F (37.2 C)   Resp 18   Ht 5\' 9"  (1.753 m)   Wt 119.3 kg   LMP 12/04/2021 (Exact Date)   SpO2 100%   BMI 38.84 kg/m   Plan: Discharge to home.   Prescriptions written for: Doxycycline  Other home care instructions discussed: Wound care  ED return instructions discussed: Pt urged to return with worsening pain, worsening swelling, expanding area of redness or streaking up extremity, fever, or any other concerns. Urged to take complete course of antibiotics as prescribed.  Follow-up  instructions discussed: Patient encouraged to follow-up with their orthopedist as planned.                           Medical Decision Making Amount and/or Complexity of Data Reviewed Labs: ordered.  Risk Prescription drug management.   Patient with postoperative wound, 5-6 weeks out from surgery with some increasing pain and drainage. Overall her exam is reassuring. WBC count is normal.  I think given her clinical symptoms, it is reasonable to cover her for a mild postoperative wound infection.  She seems reliable to return if symptoms get worse and has appropriate follow-up.  I  have low concern for sepsis, joint infection, deep abscess.  The patient's vital signs, pertinent lab work and imaging were reviewed and interpreted as discussed in the ED course. Hospitalization was considered for further testing, treatments, or serial exams/observation. However as patient is well-appearing, has a stable exam, and reassuring studies today, I do not feel that they warrant admission at this time. This plan was discussed with the patient who verbalizes agreement and comfort with this plan and seems reliable and able to return to the Emergency Department with worsening or changing symptoms.          Final Clinical Impression(s) / ED Diagnoses Final diagnoses:  Pain associated with wound    Rx / DC Orders ED Discharge Orders     None         Renne Crigler, PA-C 01/15/22 1526    Rondel Baton, MD 01/17/22 1627

## 2022-01-15 NOTE — ED Notes (Signed)
Patient verbalizes understanding of discharge instructions. Opportunity for questioning and answers were provided. Patient discharged from ED.  °

## 2022-01-15 NOTE — ED Triage Notes (Signed)
Pt arrives to ED to have surgical site checked on left buttocks. She noticed pus from wound x2 days ago.

## 2022-01-15 NOTE — Discharge Instructions (Addendum)
Please read and follow all provided instructions.  Your diagnoses today include:  1. Pain associated with wound     Tests performed today include: Vital signs. See below for your results today.  Complete blood cell count: infection fighting cell count was normal Basic metabolic panel   Medications prescribed:  Doxycycline - antibiotic  You have been prescribed an antibiotic medicine: take the entire course of medicine even if you are feeling better. Stopping early can cause the antibiotic not to work.  Take any prescribed medications only as directed.   Home care instructions:  Follow any educational materials contained in this packet. Keep affected area above the level of your heart when possible. Wash area gently twice a day with warm soapy water. Do not apply alcohol or hydrogen peroxide. Cover the area if it draining or weeping.   Follow-up instructions: Follow-up with Dr. Jena Gauss as planned.  Return instructions:  Return to the Emergency Department if you have: Fever Worsening symptoms Worsening pain Worsening swelling Redness of the skin that moves away from the affected area, especially if it streaks away from the affected area  Any other emergent concerns  Your vital signs today were: BP (!) 150/95 (BP Location: Right Arm)   Pulse 96   Temp 98.9 F (37.2 C)   Resp 18   Ht 5\' 9"  (1.753 m)   Wt 119.3 kg   LMP 12/04/2021 (Exact Date)   SpO2 100%   BMI 38.84 kg/m  If your blood pressure (BP) was elevated above 135/85 this visit, please have this repeated by your doctor within one month. --------------

## 2022-10-30 ENCOUNTER — Encounter (HOSPITAL_BASED_OUTPATIENT_CLINIC_OR_DEPARTMENT_OTHER): Payer: Self-pay

## 2022-10-30 ENCOUNTER — Other Ambulatory Visit: Payer: Self-pay

## 2022-10-30 ENCOUNTER — Emergency Department (HOSPITAL_BASED_OUTPATIENT_CLINIC_OR_DEPARTMENT_OTHER)
Admission: EM | Admit: 2022-10-30 | Discharge: 2022-10-30 | Disposition: A | Discharge status: Home / Self Care | Payer: Self-pay | Attending: Emergency Medicine | Admitting: Emergency Medicine

## 2022-10-30 DIAGNOSIS — Z91040 Latex allergy status: Secondary | ICD-10-CM | POA: Insufficient documentation

## 2022-10-30 DIAGNOSIS — L02416 Cutaneous abscess of left lower limb: Secondary | ICD-10-CM | POA: Insufficient documentation

## 2022-10-30 DIAGNOSIS — L0291 Cutaneous abscess, unspecified: Secondary | ICD-10-CM

## 2022-10-30 MED ORDER — LIDOCAINE-EPINEPHRINE (PF) 2 %-1:200000 IJ SOLN
INTRAMUSCULAR | Status: AC
  Filled 2022-10-30: qty 20

## 2022-10-30 NOTE — ED Notes (Signed)
Dc instructions reviewed with patient. Patient voiced understanding. Dc with belongings.  °

## 2022-10-30 NOTE — Discharge Instructions (Signed)
Please keep area clean and dry You may use over-the-counter pain medication such as acetaminophen and ibuprofen. You should have this rechecked in the next 2 days for packing removal Return if you are having any worsening pain, swelling, discharge, or fever. You may return for reevaluation if you feel that you are worse at any time

## 2022-10-30 NOTE — ED Triage Notes (Signed)
In for eval of abscess to left groin onset 3 days ago.

## 2022-10-30 NOTE — ED Provider Notes (Signed)
[place] EMERGENCY DEPARTMENT AT [facility] Provider Note   CSN: [id number] Arrival date & time: 10/30/22  0745     History  Chief Complaint  Patient presents with   Abscess    Paula Hawkins is a 27 y.o. female.  HPI   27 year old female no significant past medical history presents today complaining of painful swelling to the left groin area.  She states that she noted it over the past 24 to 48 hours.  She did warm soaks.  She states that it hurts whenever she moves.  She has not had any fever, nausea, vomiting, or other muscle pain.  States the last time she had something similar was when she was a child.  Denies any history of diabetes  Home Medications Prior to Admission medications   Medication Sig Start Date End Date Taking? Authorizing Provider  acetaminophen (TYLENOL) 500 MG tablet Take 2 tablets (1,000 mg total) by mouth every 6 (six) hours as needed for mild pain or fever. 12/10/21   Juliet Rude, PA-C  doxycycline (VIBRAMYCIN) 100 MG capsule Take 1 capsule (100 mg total) by mouth 2 (two) times daily. 01/15/22   Renne Crigler, PA-C  hydrOXYzine (ATARAX) 25 MG tablet Take 1 tablet (25 mg total) by mouth 3 (three) times daily as needed for anxiety. 12/10/21   Juliet Rude, PA-C  ibuprofen (ADVIL) 800 MG tablet Take 1 tablet (800 mg total) by mouth 4 (four) times daily. Take with food 12/10/21   Juliet Rude, PA-C  methocarbamol (ROBAXIN) 500 MG tablet Take 2 tablets (1,000 mg total) by mouth every 8 (eight) hours. 12/10/21   Juliet Rude, PA-C  oxyCODONE (OXY IR/ROXICODONE) 5 MG immediate release tablet Take 0.5 tablets (2.5 mg total) by mouth every 4 (four) hours as needed for breakthrough pain. 12/10/21   Juliet Rude, PA-C  polyethylene glycol (MIRALAX / GLYCOLAX) 17 g packet Take 17 g by mouth daily as needed for mild constipation. 12/10/21   Juliet Rude, PA-C  traMADol (ULTRAM) 50 MG tablet Take 1-2 tablets (50-100 mg total) by mouth  every 6 (six) hours as needed (50 mg for moderate pain, 100 mg for severe). 12/10/21   Juliet Rude, PA-C  Vitamin D, Ergocalciferol, (DRISDOL) 1.25 MG (50000 UNIT) CAPS capsule Take 1 capsule (50,000 Units total) by mouth every 7 (seven) days. 12/14/21   Juliet Rude, PA-C  topiramate (TOPAMAX) 50 MG tablet Take 1 tablet (50 mg total) by mouth at bedtime. Patient not taking: Reported on 01/05/2020 03/26/18 01/05/20  Elvina Sidle, MD      Allergies    Kiwi extract, Latex, and Pineapple    Review of Systems   Review of Systems  Physical Exam Updated Vital Signs BP (!) 148/99 (BP Location: Right Arm)   Pulse 97   Temp 98.5 F (36.9 C) (Oral)   Resp 16   Ht 1.753 m (5\' 9" )   Wt 124.7 kg   SpO2 100%   BMI 40.61 kg/m  Physical Exam Vitals and nursing note reviewed.  Constitutional:      General: She is not in acute distress.    Appearance: Normal appearance. She is well-developed.  HENT:     Head: Normocephalic and atraumatic.     Right Ear: External ear normal.     Left Ear: External ear normal.     Nose: Nose normal.  Eyes:     Conjunctiva/sclera: Conjunctivae normal.     Pupils: Pupils are equal, round,  and reactive to light.  Cardiovascular:     Rate and Rhythm: Normal rate.  Pulmonary:     Effort: Pulmonary effort is normal.  Musculoskeletal:        General: Normal range of motion.     Cervical back: Normal range of motion and neck supple.  Skin:    General: Skin is warm and dry.     Capillary Refill: Capillary refill takes less than 2 seconds.     Comments: Tender fluctuant area left groin between left labia and thigh with 3 x 3 cm fluctuance without surrounding erythema or induration  Neurological:     Mental Status: She is alert and oriented to person, place, and time.     Motor: No abnormal muscle tone.     Coordination: Coordination normal.  Psychiatric:        Behavior: Behavior normal.        Thought Content: Thought content normal.     ED  Results / Procedures / Treatments   Labs (all labs ordered are listed, but only abnormal results are displayed) Labs Reviewed - No data to display  EKG None  Radiology No results found.  Procedures .Marland KitchenIncision and Drainage  Date/Time: 10/30/2022 8:41 AM  Performed by: Margarita Grizzle, MD Authorized by: Margarita Grizzle, MD   Consent:    Consent obtained:  Verbal   Consent given by:  Patient   Risks, benefits, and alternatives were discussed: yes     Risks discussed:  Incomplete drainage, bleeding and infection   Alternatives discussed:  No treatment Universal protocol:    Patient identity confirmed:  Verbally with patient Location:    Type:  Abscess   Size:  3 x 3 cm   Location:  Lower extremity   Lower extremity location:  Leg   Leg location: Between left medial thigh and left labia. Pre-procedure details:    Skin preparation:  Povidone-iodine Sedation:    Sedation type:  None Anesthesia:    Anesthesia method:  Local infiltration Procedure type:    Complexity:  Complex Procedure details:    Ultrasound guidance: no     Needle aspiration: no     Incision types:  Single straight   Wound management:  Probed and deloculated, irrigated with saline and extensive cleaning   Drainage:  Purulent   Drainage amount:  Moderate   Wound treatment:  Wound left open   Packing materials:  1/4 in iodoform gauze Post-procedure details:    Procedure completion:  Tolerated     Medications Ordered in ED Medications  lidocaine-EPINEPHrine (XYLOCAINE W/EPI) 2 %-1:200000 (PF) injection (  Given by Other 10/30/22 8295)    ED Course/ Medical Decision Making/ A&P                                 Medical Decision Making  27 year old female presents today with pain and swelling to the left medial thigh.  Patient with abscess on physical exam Irrigation and drainage done as per note Patient tolerated procedure well Patient is hemodynamically stable and no evidence of sepsis, vaginal or  rectal involvement, or other systemic signs Patient advised regarding need for close follow-up and return precautions and voices understanding.       Final Clinical Impression(s) / ED Diagnoses Final diagnoses:  Abscess    Rx / DC Orders ED Discharge Orders     None         Margarita Grizzle, MD  10/30/22 0844  

## 2022-11-01 ENCOUNTER — Ambulatory Visit (HOSPITAL_COMMUNITY)
Admission: EM | Admit: 2022-11-01 | Discharge: 2022-11-01 | Disposition: A | Discharge status: Home / Self Care | Payer: Self-pay | Attending: Family Medicine | Admitting: Family Medicine

## 2022-11-01 ENCOUNTER — Encounter (HOSPITAL_COMMUNITY): Payer: Self-pay

## 2022-11-01 DIAGNOSIS — Z5189 Encounter for other specified aftercare: Secondary | ICD-10-CM

## 2022-11-01 NOTE — ED Triage Notes (Signed)
Patient was seen at [facility] on 10/30/22 and had packing placed to a left groin abscess. Patient states she is here to have packing removed.

## 2022-11-01 NOTE — ED Provider Notes (Addendum)
[facility]   [id number] 11/01/22 Arrival Time: 1445  ASSESSMENT & PLAN:  1. Visit for wound check    Packing removed without complication. Simple wound care. OTC analgesics if needed.   Follow-up Information     [facility].   Specialty: Urgent Care Why: As needed. Contact information: [address] [place] [address] [phone]                Reviewed expectations re: course of current medical issues. Questions answered. Outlined signs and symptoms indicating need for more acute intervention. Patient verbalized understanding. After Visit Summary given.   SUBJECTIVE: History from: patient. Paula Hawkins is a 27 y.o. female who was seen at [facility] on 10/30/22 and had packing placed to a left groin abscess. Patient states she is here to have packing removed. No concerns. Denies fever.   OBJECTIVE:  Vitals:   11/01/22 1522  BP: 116/81  Pulse: 87  Resp: 16  Temp: 99.1 F (37.3 C)  TempSrc: Oral  SpO2: 96%    General appearance: alert; no distress GU: female CMA chaperone present; small open wound to L groin with packing visible; no active drainage Extremities: no edema; symmetrical with no gross deformities Skin: warm and dry Neurologic: normal gait; normal symmetric reflexes Psychological: alert and cooperative; normal mood and affect    Allergies  Allergen Reactions   Kiwi Extract Itching    Itching in Ears and Throat   Latex Swelling   Pineapple     Burning in Throat and Tongue    Past Medical History:  Diagnosis Date   Abnormal uterine bleeding unrelated to menstrual cycle 08/25/2015   Asthma    Chlamydia    Migraine    migraines   Uterine polyp    Social History   Socioeconomic History   Marital status: Single    Spouse name: Not on file   Number of children: Not on file   Years of education: Not on file   Highest education level: Not on file  Occupational History    Not on file  Tobacco Use   Smoking status: Some Days    Types: Cigars   Smokeless tobacco: Never  Vaping Use   Vaping status: Never Used  Substance and Sexual Activity   Alcohol use: Yes    Comment: occasionally   Drug use: No   Sexual activity: Yes    Birth control/protection: None  Other Topics Concern   Not on file  Social History Narrative   Not on file   Social Determinants of Health   Financial Resource Strain: Not on file  Food Insecurity: No Food Insecurity (12/05/2021)   Hunger Vital Sign    Worried About Running Out of Food in the Last Year: Never true    Ran Out of Food in the Last Year: Never true  Transportation Needs: No Transportation Needs (12/05/2021)   PRAPARE - Administrator, Civil Service (Medical): No    Lack of Transportation (Non-Medical): No  Physical Activity: Not on file  Stress: Not on file  Social Connections: Not on file  Intimate Partner Violence: Not At Risk (12/05/2021)   Humiliation, Afraid, Rape, and Kick questionnaire    Fear of Current or Ex-Partner: No    Emotionally Abused: No    Physically Abused: No    Sexually Abused: No   Family History  Problem Relation Age of Onset   Diabetes Other    Asthma Other  Cancer Other    Asthma Father    Diabetes Mother    Asthma Mother    Hypertension Neg Hx    Past Surgical History:  Procedure Laterality Date   HYSTEROSCOPY WITH D & C N/A 11/23/2016   Procedure: DILATATION AND CURETTAGE /HYSTEROSCOPY;  Surgeon: Catalina Antigua, MD;  Location: [facility];  Service: Gynecology;  Laterality: N/A;   ORIF PELVIC FRACTURE WITH PERCUTANEOUS SCREWS Left 12/06/2021   Procedure: ORIF PELVIC FRACTURE WITH PERCUTANEOUS SCREWS;  Surgeon: Roby Lofts, MD;  Location: [facility] OR;  Service: Orthopedics;  Laterality: Left;   TONSILLECTOMY       Mardella Layman, MD 11/01/22 2725    Mardella Layman, MD 11/01/22 [phone]

## 2022-11-07 ENCOUNTER — Other Ambulatory Visit: Payer: Self-pay

## 2022-11-07 ENCOUNTER — Emergency Department (HOSPITAL_COMMUNITY)
Admission: EM | Admit: 2022-11-07 | Discharge: 2022-11-08 | Disposition: A | Discharge status: Home / Self Care | Payer: Self-pay | Attending: Emergency Medicine | Admitting: Emergency Medicine

## 2022-11-07 ENCOUNTER — Encounter (HOSPITAL_COMMUNITY): Payer: Self-pay

## 2022-11-07 DIAGNOSIS — G44209 Tension-type headache, unspecified, not intractable: Secondary | ICD-10-CM | POA: Insufficient documentation

## 2022-11-07 DIAGNOSIS — Z91040 Latex allergy status: Secondary | ICD-10-CM | POA: Insufficient documentation

## 2022-11-07 LAB — URINALYSIS, ROUTINE W REFLEX MICROSCOPIC
Bilirubin Urine: NEGATIVE
Glucose, UA: NEGATIVE mg/dL
Hgb urine dipstick: NEGATIVE
Ketones, ur: NEGATIVE mg/dL
Leukocytes,Ua: NEGATIVE
Nitrite: NEGATIVE
Protein, ur: NEGATIVE mg/dL
Specific Gravity, Urine: 1.02 (ref 1.005–1.030)
pH: 6 (ref 5.0–8.0)

## 2022-11-07 LAB — PREGNANCY, URINE: Preg Test, Ur: NEGATIVE

## 2022-11-07 NOTE — ED Triage Notes (Signed)
Pt arrived from home via POV c/o head ache x 3 days. Pt states that she has not taken any medication for headache not even tried OTC meds. 7/10 on pain scale across frontal region

## 2022-11-07 NOTE — ED Provider Triage Note (Signed)
Emergency Medicine Provider Triage Evaluation Note  Paula Hawkins , a 27 y.o. female  was evaluated in triage.  Pt complains of rental headache which she describes as a migraine.  Has been there for 3 days.  She has not taken anything for it because Tylenol and Advil make her stomach hurt.  History of the same.  No red flag symptoms.  Review of Systems  Positive: Headache Negative: Fever  Physical Exam  BP (!) 144/88 (BP Location: Left Arm)   Pulse 95   Temp 97.9 F (36.6 C) (Oral)   Resp 19   LMP 10/31/2022   SpO2 95%  Gen:   Awake, no distress   Resp:  Normal effort  MSK:   Moves extremities without difficulty  Other:    Medical Decision Making  Medically screening exam initiated at 10:28 PM.  Appropriate orders placed.  Paula Hawkins was informed that the remainder of the evaluation will be completed by another provider, this initial triage assessment does not replace that evaluation, and the importance of remaining in the ED until their evaluation is complete.     Arthor Captain, PA-C 11/07/22 2321

## 2022-11-08 MED ORDER — METOCLOPRAMIDE HCL 10 MG PO TABS: 10.0000 mg | ORAL_TABLET | Freq: Three times a day (TID) | ORAL | 0 refills | Status: DC | PRN

## 2022-11-08 MED ORDER — PROCHLORPERAZINE EDISYLATE 10 MG/2ML IJ SOLN
10.0000 mg | Freq: Once | INTRAMUSCULAR | Status: AC
  Administered 2022-11-08: 10 mg via INTRAMUSCULAR
  Filled 2022-11-08: qty 2

## 2022-11-08 MED ORDER — KETOROLAC TROMETHAMINE 30 MG/ML IJ SOLN
30.0000 mg | Freq: Once | INTRAMUSCULAR | Status: AC
  Administered 2022-11-08: 30 mg via INTRAMUSCULAR
  Filled 2022-11-08: qty 1

## 2022-11-08 MED ORDER — DIPHENHYDRAMINE HCL 25 MG PO TABS: 25.0000 mg | ORAL_TABLET | Freq: Four times a day (QID) | ORAL | 0 refills | Status: DC

## 2022-11-08 NOTE — ED Provider Notes (Signed)
[place] EMERGENCY DEPARTMENT AT [facility] Provider Note   CSN: [id number] Arrival date & time: 11/07/22  2138     History  Chief Complaint  Patient presents with   Headache    Paula Hawkins is a 27 y.o. female.  The history is provided by the patient and medical records.  Headache  27 y.o. F with hx of migraine headaches, presenting to the ED for headache.  Patient states persistent headache x3 days.  Pain all across her forehead without radiation, feels like a band.  Has photophobia and nausea without vomiting.  She does feel tension in her neck but denies pain/stiffness.  No fever/chills.  She did not take tylenol or motrin as it causes stomach upset.  She was previously referred to neurology in 2017 but never had formal evaluation.  No real issues since that time.  Does admit she has been under a lot of stress recently-- family issues, twin sister had a miscarriage, she has had some other health issues a few ago, etc.    Home Medications Prior to Admission medications   Medication Sig Start Date End Date Taking? Authorizing Provider  acetaminophen (TYLENOL) 500 MG tablet Take 2 tablets (1,000 mg total) by mouth every 6 (six) hours as needed for mild pain or fever. 12/10/21   Juliet Rude, PA-C  doxycycline (VIBRAMYCIN) 100 MG capsule Take 1 capsule (100 mg total) by mouth 2 (two) times daily. 01/15/22   Renne Crigler, PA-C  hydrOXYzine (ATARAX) 25 MG tablet Take 1 tablet (25 mg total) by mouth 3 (three) times daily as needed for anxiety. 12/10/21   Juliet Rude, PA-C  ibuprofen (ADVIL) 800 MG tablet Take 1 tablet (800 mg total) by mouth 4 (four) times daily. Take with food 12/10/21   Juliet Rude, PA-C  methocarbamol (ROBAXIN) 500 MG tablet Take 2 tablets (1,000 mg total) by mouth every 8 (eight) hours. 12/10/21   Juliet Rude, PA-C  oxyCODONE (OXY IR/ROXICODONE) 5 MG immediate release tablet Take 0.5 tablets (2.5 mg total) by mouth every 4 (four)  hours as needed for breakthrough pain. 12/10/21   Juliet Rude, PA-C  polyethylene glycol (MIRALAX / GLYCOLAX) 17 g packet Take 17 g by mouth daily as needed for mild constipation. 12/10/21   Juliet Rude, PA-C  traMADol (ULTRAM) 50 MG tablet Take 1-2 tablets (50-100 mg total) by mouth every 6 (six) hours as needed (50 mg for moderate pain, 100 mg for severe). 12/10/21   Juliet Rude, PA-C  Vitamin D, Ergocalciferol, (DRISDOL) 1.25 MG (50000 UNIT) CAPS capsule Take 1 capsule (50,000 Units total) by mouth every 7 (seven) days. 12/14/21   Juliet Rude, PA-C  topiramate (TOPAMAX) 50 MG tablet Take 1 tablet (50 mg total) by mouth at bedtime. Patient not taking: Reported on 01/05/2020 03/26/18 01/05/20  Elvina Sidle, MD      Allergies    Latex, Kiwi extract, and Pineapple    Review of Systems   Review of Systems  Neurological:  Positive for headaches.  All other systems reviewed and are negative.   Physical Exam Updated Vital Signs BP (!) 152/91 (BP Location: Right Arm)   Pulse 98   Temp 98.2 F (36.8 C) (Oral)   Resp 16   LMP 10/31/2022 (Exact Date)   SpO2 100%   Physical Exam Vitals and nursing note reviewed.  Constitutional:      General: She is not in acute distress.    Appearance: She is  well-developed. She is not diaphoretic.  HENT:     Head: Normocephalic and atraumatic.     Right Ear: External ear normal.     Left Ear: External ear normal.  Eyes:     Extraocular Movements: EOM normal.     Conjunctiva/sclera: Conjunctivae normal.     Pupils: Pupils are equal, round, and reactive to light.  Neck:     Comments: No rigidity, no meningismus Cardiovascular:     Rate and Rhythm: Normal rate and regular rhythm.     Heart sounds: Normal heart sounds. No murmur heard. Pulmonary:     Effort: Pulmonary effort is normal. No respiratory distress.     Breath sounds: Normal breath sounds. No wheezing or rhonchi.  Abdominal:     General: Bowel sounds are normal.      Palpations: Abdomen is soft.     Tenderness: There is no abdominal tenderness. There is no guarding.  Musculoskeletal:        General: No edema. Normal range of motion.     Cervical back: Full passive range of motion without pain, normal range of motion and neck supple. No rigidity.  Skin:    General: Skin is warm and dry.     Findings: No rash.  Neurological:     Mental Status: She is alert and oriented to person, place, and time.     Cranial Nerves: No cranial nerve deficit.     Sensory: No sensory deficit.     Motor: No tremor or seizure activity.     Comments: AAOx3, answering questions and following commands appropriately; equal strength UE and LE bilaterally; CN grossly intact; moves all extremities appropriately without ataxia; no focal neuro deficits or facial asymmetry appreciated  Psychiatric:        Mood and Affect: Mood and affect normal.        Behavior: Behavior normal.        Thought Content: Thought content normal.     ED Results / Procedures / Treatments   Labs (all labs ordered are listed, but only abnormal results are displayed) Labs Reviewed  URINALYSIS, ROUTINE W REFLEX MICROSCOPIC - Abnormal; Notable for the following components:      Result Value   APPearance HAZY (*)    All other components within normal limits  PREGNANCY, URINE    EKG None  Radiology No results found.  Procedures Procedures    Medications Ordered in ED Medications  ketorolac (TORADOL) 30 MG/ML injection 30 mg (has no administration in time range)  prochlorperazine (COMPAZINE) injection 10 mg (has no administration in time range)    ED Course/ Medical Decision Making/ A&P                                 Medical Decision Making Risk OTC drugs. Prescription drug management.   27 year old female presenting to the ED with headache.  She reports a migraine headache in a bandlike distribution around her head.  Also feels some soreness in her neck but denies any stiffness.   She is afebrile and nontoxic in appearance.  Her neurologic exam is nonfocal.  She has no meningeal signs.  No red flag symptoms.  Does sound like she has been under a lot of stress at home recently which may be contributing.  Will give migraine cocktail here and reassess.  6:03 AM Patient feeling significantly better after medications here.  Reports tension in her neck seems to  be resolving as well.  I suspect she is likely having tension headaches from increased stress recently.  She remains without any focal neurologic deficits or meningeal signs.  Stable for discharge.  She expressed interest in being referred back to neurology for evaluation of headaches, this has been done.  Work note provided.  Can return here for any new/acute changes.  Final Clinical Impression(s) / ED Diagnoses Final diagnoses:  Tension headache    Rx / DC Orders ED Discharge Orders          Ordered    diphenhydrAMINE (BENADRYL) 25 MG tablet  Every 6 hours        11/08/22 0604    metoCLOPramide (REGLAN) 10 MG tablet  3 times daily PRN        11/08/22 0604    Ambulatory referral to Neurology       Comments: An appointment is requested in approximately: 8 weeks   11/08/22 0605              Garlon Hatchet, PA-C 11/08/22 0606    Nira Conn, MD 11/08/22 [phone]

## 2022-11-08 NOTE — Discharge Instructions (Signed)
Can take the benadryl/reglan together with lots of water should headache recur. I have placed referral to neurology as requested.  They should be contacting you with appt. Return here for any new/acute changes.

## 2022-11-28 ENCOUNTER — Other Ambulatory Visit (HOSPITAL_COMMUNITY): Payer: Self-pay

## 2022-11-28 MED ORDER — INFLUENZA VIRUS VACC SPLIT PF (FLUZONE) 0.5 ML IM SUSY
0.5000 mL | PREFILLED_SYRINGE | Freq: Once | INTRAMUSCULAR | 0 refills | Status: AC
  Filled 2022-11-28: qty 0.5, 1d supply, fill #0

## 2022-12-05 IMAGING — CR DG KNEE COMPLETE 4+V*L*
4 series · 4 of 4 positions shown · non-contrast
Comparison: None.

CLINICAL DATA: Pt reported left anterior/lateral knee pain with
some swelling x 3 days without any injury. Pt reported inability to
straighten and "lock" her left knee to stand and walk.

EXAM:
LEFT KNEE - COMPLETE 4+ VIEW

[t knee ap left]
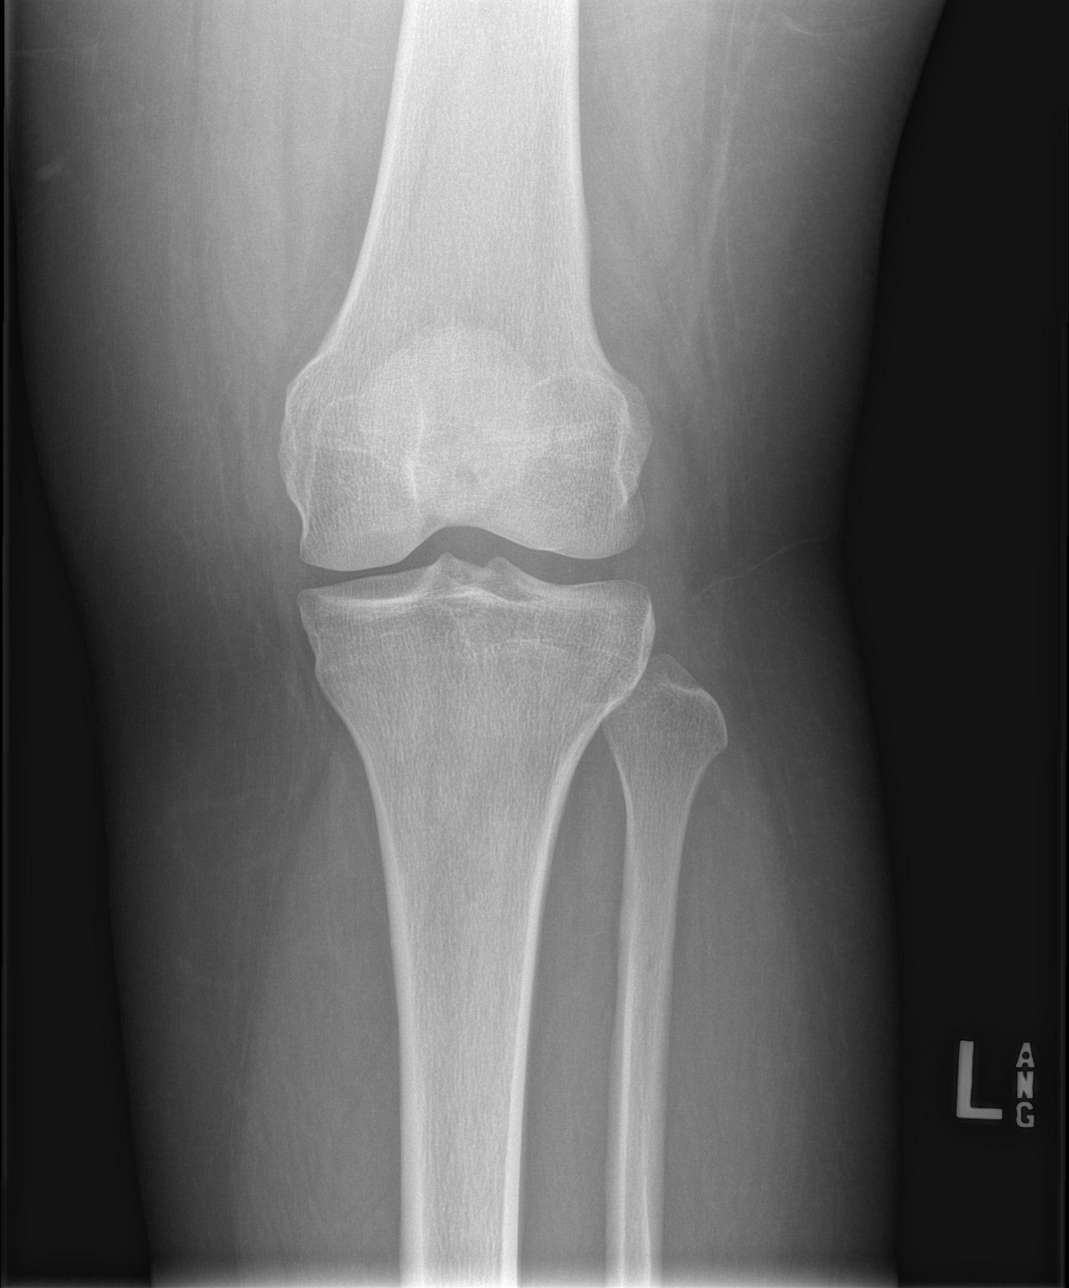

[t knee obl left (1 of 2)]
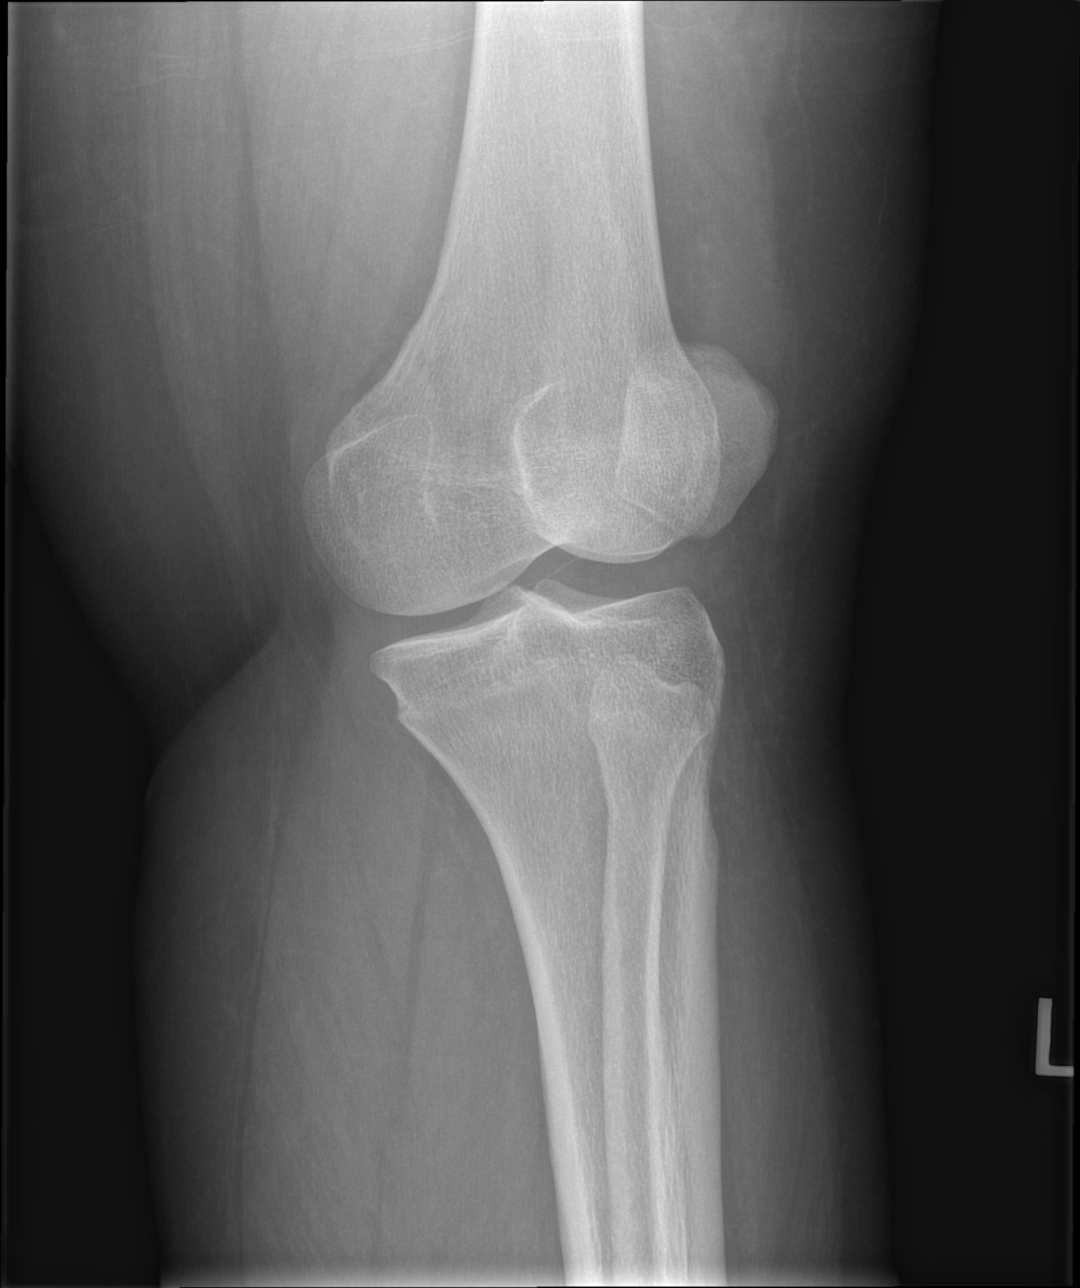

[t knee obl left (2 of 2)]
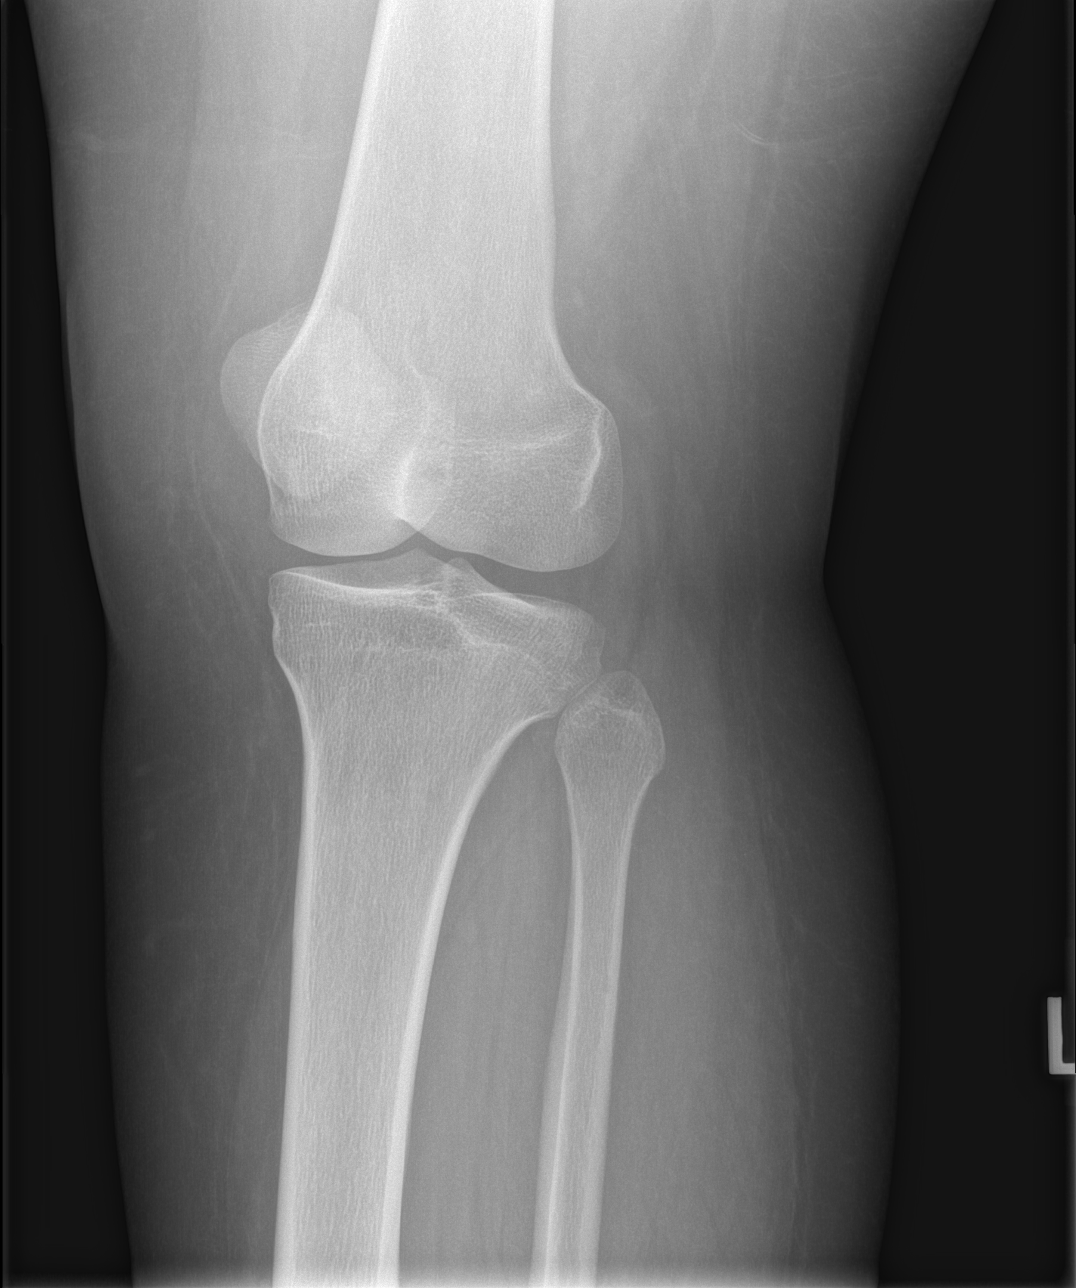

[t knee lat left]
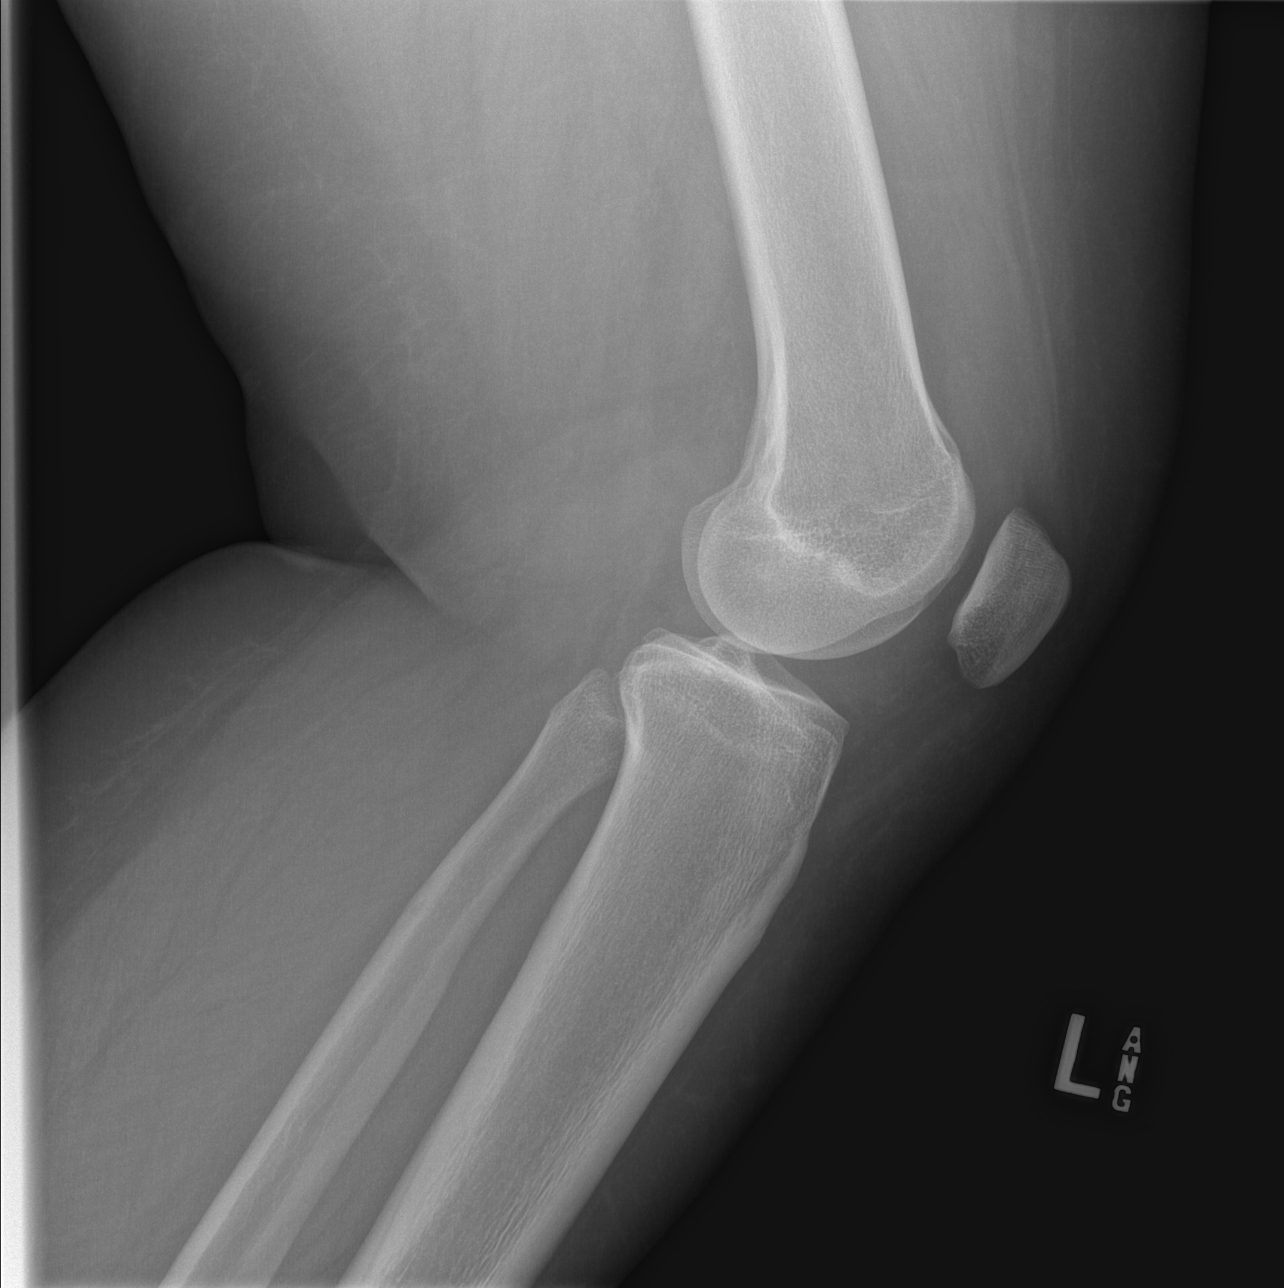

[4 of 4 positions shown; findings below may reference images not displayed]

FINDINGS: No evidence of fracture, dislocation, or joint effusion. No evidence
of arthropathy or other focal bone abnormality. Soft tissues are
unremarkable.
IMPRESSION: Negative.

## 2023-01-26 ENCOUNTER — Emergency Department (HOSPITAL_BASED_OUTPATIENT_CLINIC_OR_DEPARTMENT_OTHER): Payer: Self-pay | Admitting: Radiology

## 2023-01-26 ENCOUNTER — Encounter (HOSPITAL_BASED_OUTPATIENT_CLINIC_OR_DEPARTMENT_OTHER): Payer: Self-pay | Admitting: Emergency Medicine

## 2023-01-26 ENCOUNTER — Other Ambulatory Visit: Payer: Self-pay

## 2023-01-26 DIAGNOSIS — W228XXA Striking against or struck by other objects, initial encounter: Secondary | ICD-10-CM | POA: Insufficient documentation

## 2023-01-26 DIAGNOSIS — Z91040 Latex allergy status: Secondary | ICD-10-CM | POA: Insufficient documentation

## 2023-01-26 DIAGNOSIS — S60052A Contusion of left little finger without damage to nail, initial encounter: Secondary | ICD-10-CM | POA: Insufficient documentation

## 2023-01-26 NOTE — ED Triage Notes (Signed)
Last night Hit outer right foot, red and painful  Pain in left foot, swollen toe. Since 12/26/22

## 2023-01-27 ENCOUNTER — Emergency Department (HOSPITAL_BASED_OUTPATIENT_CLINIC_OR_DEPARTMENT_OTHER)
Admission: EM | Admit: 2023-01-27 | Discharge: 2023-01-27 | Disposition: A | Discharge status: Home / Self Care | Payer: Self-pay | Attending: Emergency Medicine | Admitting: Emergency Medicine

## 2023-01-27 DIAGNOSIS — M79671 Pain in right foot: Secondary | ICD-10-CM

## 2023-01-27 NOTE — ED Notes (Signed)
ED Provider at bedside. 

## 2023-01-27 NOTE — Discharge Instructions (Signed)
Wear Ace bandage for comfort and support.  Rest.  Take ibuprofen 600 mg 3 times daily for the next 5 days.  Follow-up with primary doctor if not improving in the next week.

## 2023-01-27 NOTE — ED Provider Notes (Signed)
[place] EMERGENCY DEPARTMENT AT [facility] Provider Note   CSN: [id number] Arrival date & time: 01/26/23  2205     History  Chief Complaint  Patient presents with   Foot Pain    Paula Hawkins is a 27 y.o. female.  Patient is a 27 year old female presenting with complaints of bilateral foot pain.  She reports irritating the lateral aspect of her distal right foot 1 month ago and has been more painful since bumping it on the couch last night.  She also describes injuring her left fourth toe several weeks ago, but did not seek medical attention.  She has continued swelling of the toe.  The history is provided by the patient.       Home Medications Prior to Admission medications   Medication Sig Start Date End Date Taking? Authorizing Provider  acetaminophen (TYLENOL) 500 MG tablet Take 2 tablets (1,000 mg total) by mouth every 6 (six) hours as needed for mild pain or fever. 12/10/21   Juliet Rude, PA-C  diphenhydrAMINE (BENADRYL) 25 MG tablet Take 1 tablet (25 mg total) by mouth every 6 (six) hours. 11/08/22   Garlon Hatchet, PA-C  doxycycline (VIBRAMYCIN) 100 MG capsule Take 1 capsule (100 mg total) by mouth 2 (two) times daily. 01/15/22   Renne Crigler, PA-C  hydrOXYzine (ATARAX) 25 MG tablet Take 1 tablet (25 mg total) by mouth 3 (three) times daily as needed for anxiety. 12/10/21   Juliet Rude, PA-C  ibuprofen (ADVIL) 800 MG tablet Take 1 tablet (800 mg total) by mouth 4 (four) times daily. Take with food 12/10/21   Juliet Rude, PA-C  methocarbamol (ROBAXIN) 500 MG tablet Take 2 tablets (1,000 mg total) by mouth every 8 (eight) hours. 12/10/21   Juliet Rude, PA-C  metoCLOPramide (REGLAN) 10 MG tablet Take 1 tablet (10 mg total) by mouth 3 (three) times daily as needed for nausea (headache / nausea). 11/08/22   Garlon Hatchet, PA-C  oxyCODONE (OXY IR/ROXICODONE) 5 MG immediate release tablet Take 0.5 tablets (2.5 mg total) by mouth every 4 (four)  hours as needed for breakthrough pain. 12/10/21   Juliet Rude, PA-C  polyethylene glycol (MIRALAX / GLYCOLAX) 17 g packet Take 17 g by mouth daily as needed for mild constipation. 12/10/21   Juliet Rude, PA-C  traMADol (ULTRAM) 50 MG tablet Take 1-2 tablets (50-100 mg total) by mouth every 6 (six) hours as needed (50 mg for moderate pain, 100 mg for severe). 12/10/21   Juliet Rude, PA-C  Vitamin D, Ergocalciferol, (DRISDOL) 1.25 MG (50000 UNIT) CAPS capsule Take 1 capsule (50,000 Units total) by mouth every 7 (seven) days. 12/14/21   Juliet Rude, PA-C  topiramate (TOPAMAX) 50 MG tablet Take 1 tablet (50 mg total) by mouth at bedtime. Patient not taking: Reported on 01/05/2020 03/26/18 01/05/20  Elvina Sidle, MD      Allergies    Latex, Kiwi extract, and Pineapple    Review of Systems   Review of Systems  All other systems reviewed and are negative.   Physical Exam Updated Vital Signs BP (!) 148/83 (BP Location: Right Arm)   Pulse 93   Temp 97.8 F (36.6 C)   Resp 16   LMP 01/19/2023   SpO2 98%  Physical Exam Vitals and nursing note reviewed.  Constitutional:      Appearance: Normal appearance.  Pulmonary:     Effort: Pulmonary effort is normal.  Musculoskeletal:  Comments: The left foot is grossly normal in appearance.  There is perhaps mild swelling of the left fourth toe, but no deformity or discoloration.  The right foot has tenderness over the lateral aspect just proximal to the fifth toe.  There is no palpable or visible abnormality.  Skin:    General: Skin is warm and dry.  Neurological:     Mental Status: She is alert and oriented to person, place, and time.     ED Results / Procedures / Treatments   Labs (all labs ordered are listed, but only abnormal results are displayed) Labs Reviewed - No data to display  EKG None  Radiology DG Foot Complete Left Result Date: 01/26/2023 CLINICAL DATA:  409811 Injury [id number] [id number] Pain 144615 Hit  outer right foot, red and painful Pain in left foot, swollen toe. Since 12/26/22 EXAM: LEFT FOOT - COMPLETE 3+ VIEW COMPARISON:  None Available. FINDINGS: There is no evidence of fracture or dislocation. There is no evidence of arthropathy or other focal bone abnormality. Soft tissues are unremarkable. IMPRESSION: Negative. Electronically Signed   By: Tish Frederickson M.D.   On: 01/26/2023 22:47    Procedures Procedures    Medications Ordered in ED Medications - No data to display  ED Course/ Medical Decision Making/ A&P  The left foot has negative x-rays.  This appears to be a contusion of her fourth toe.  No x-rays indicated on the right foot.  There is tenderness over the lateral aspect, but no palpable abnormality.  Patient to wear an Ace bandage, rest, and take NSAIDs.  To follow-up as needed.  Final Clinical Impression(s) / ED Diagnoses Final diagnoses:  None    Rx / DC Orders ED Discharge Orders     None         Geoffery Lyons, MD 01/27/23 [phone]

## 2023-02-28 ENCOUNTER — Ambulatory Visit (HOSPITAL_COMMUNITY)
Admission: EM | Admit: 2023-02-28 | Discharge: 2023-02-28 | Disposition: A | Discharge status: Home / Self Care | Payer: Self-pay | Attending: Emergency Medicine | Admitting: Emergency Medicine

## 2023-02-28 ENCOUNTER — Encounter (HOSPITAL_COMMUNITY): Payer: Self-pay

## 2023-02-28 DIAGNOSIS — N6310 Unspecified lump in the right breast, unspecified quadrant: Secondary | ICD-10-CM

## 2023-02-28 DIAGNOSIS — N898 Other specified noninflammatory disorders of vagina: Secondary | ICD-10-CM

## 2023-02-28 DIAGNOSIS — R519 Headache, unspecified: Secondary | ICD-10-CM

## 2023-02-28 MED ORDER — DEXAMETHASONE SODIUM PHOSPHATE 10 MG/ML IJ SOLN
10.0000 mg | Freq: Once | INTRAMUSCULAR | Status: AC
  Administered 2023-02-28: 10 mg via INTRAMUSCULAR

## 2023-02-28 MED ORDER — KETOROLAC TROMETHAMINE 30 MG/ML IJ SOLN
30.0000 mg | Freq: Once | INTRAMUSCULAR | Status: AC
  Administered 2023-02-28: 30 mg via INTRAMUSCULAR

## 2023-02-28 MED ORDER — DEXAMETHASONE SODIUM PHOSPHATE 10 MG/ML IJ SOLN
INTRAMUSCULAR | Status: AC
  Filled 2023-02-28: qty 1

## 2023-02-28 MED ORDER — KETOROLAC TROMETHAMINE 30 MG/ML IJ SOLN
INTRAMUSCULAR | Status: AC
  Filled 2023-02-28: qty 1

## 2023-02-28 MED ORDER — ONDANSETRON 4 MG PO TBDP
ORAL_TABLET | ORAL | Status: AC
  Filled 2023-02-28: qty 1

## 2023-02-28 MED ORDER — ONDANSETRON 4 MG PO TBDP
4.0000 mg | ORAL_TABLET | Freq: Once | ORAL | Status: AC
  Administered 2023-02-28: 4 mg via ORAL
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
## 2023-02-28 NOTE — ED Provider Notes (Signed)
[facility]    CSN: [id number] Arrival date & time: 02/28/23  1826      History   Chief Complaint Chief Complaint  Patient presents with   Breast Mass   Vaginal Discharge   Headache    HPI Paula Hawkins is a 28 y.o. female.   Patient presents with multiple small painless lumps noted to right breast x 1 year. Patient is requesting a mammogram to check for breast cancer. Patient reports family history of breast cancer.  Patient also presents with vaginal discharge x 1 week. Denies recent unprotected sexual intercourse. Denies abdominal pain, nausea, vomiting, diarrhea, dysuria, hematuria, and vaginal pain.   Additionally, patient presents with headache x 1 month. Patient reports constant headache everyday for a month that at times is tolerable, but sometimes becomes more severe. Patient also endorses mild nausea. Denies blurred vision, photophobia, numbness, weakness, slurred speech, and confusion. History of migraines. Patient states she has an appointment with neurology on 2/10 regarding chronic migraines.    Vaginal Discharge Headache   Past Medical History:  Diagnosis Date   Abnormal uterine bleeding unrelated to menstrual cycle 08/25/2015   Asthma    Chlamydia    Migraine    migraines   Uterine polyp     Patient Active Problem List   Diagnosis Date Noted   Acute posttraumatic stress disorder 12/07/2021   Pelvic fracture (HCC) 12/04/2021   Abnormal uterine bleeding unrelated to menstrual cycle 08/25/2015    Past Surgical History:  Procedure Laterality Date   HYSTEROSCOPY WITH D & C N/A 11/23/2016   Procedure: DILATATION AND CURETTAGE /HYSTEROSCOPY;  Surgeon: Catalina Antigua, MD;  Location: [facility];  Service: Gynecology;  Laterality: N/A;   ORIF PELVIC FRACTURE WITH PERCUTANEOUS SCREWS Left 12/06/2021   Procedure: ORIF PELVIC FRACTURE WITH PERCUTANEOUS SCREWS;  Surgeon: Roby Lofts, MD;  Location: [facility];  Service: Orthopedics;   Laterality: Left;   TONSILLECTOMY      OB History     Gravida  0   Para  0   Term  0   Preterm  0   AB  0   Living  0      SAB  0   IAB  0   Ectopic  0   Multiple  0   Live Births               Home Medications    Prior to Admission medications   Medication Sig Start Date End Date Taking? Authorizing Provider  topiramate (TOPAMAX) 50 MG tablet Take 1 tablet (50 mg total) by mouth at bedtime. Patient not taking: Reported on 01/05/2020 03/26/18 01/05/20  Elvina Sidle, MD    Family History Family History  Problem Relation Age of Onset   Diabetes Other    Asthma Other    Cancer Other    Asthma Father    Diabetes Mother    Asthma Mother    Hypertension Neg Hx     Social History Social History   Tobacco Use   Smoking status: Some Days    Types: Cigars   Smokeless tobacco: Never  Vaping Use   Vaping status: Never Used  Substance Use Topics   Alcohol use: Yes    Comment: occasionally   Drug use: No     Allergies   Latex, Kiwi extract, and Pineapple   Review of Systems Review of Systems  Genitourinary:  Positive for vaginal discharge.  Neurological:  Positive for headaches.  Per  HPI.    Physical Exam Triage Vital Signs ED Triage Vitals  Encounter Vitals Group     BP 02/28/23 1913 128/87     Systolic BP Percentile --      Diastolic BP Percentile --      Pulse Rate 02/28/23 1913 (!) 108     Resp 02/28/23 1913 16     Temp 02/28/23 1913 99.9 F (37.7 C)     Temp Source 02/28/23 1913 Oral     SpO2 02/28/23 1913 95 %     Weight 02/28/23 1913 270 lb (122.5 kg)     Height 02/28/23 1913 5\' 9"  (1.753 m)     Head Circumference --      Peak Flow --      Pain Score 02/28/23 1911 6     Pain Loc --      Pain Education --      Exclude from Growth Chart --    No data found.  Updated Vital Signs BP 128/87 (BP Location: Right Arm)   Pulse (!) 108   Temp 99.9 F (37.7 C) (Oral)   Resp 16   Ht 5\' 9"  (1.753 m)   Wt 270 lb (122.5 kg)    LMP 02/17/2023 (Approximate)   SpO2 95%   BMI 39.87 kg/m   Visual Acuity Right Eye Distance:   Left Eye Distance:   Bilateral Distance:    Right Eye Near:   Left Eye Near:    Bilateral Near:     Physical Exam Vitals and nursing note reviewed. Exam conducted with a chaperone present.  Constitutional:      General: She is awake. She is not in acute distress.    Appearance: Normal appearance. She is well-developed and well-groomed. She is not ill-appearing.  HENT:     Head: Normocephalic.  Eyes:     Extraocular Movements: Extraocular movements intact.     Pupils: Pupils are equal, round, and reactive to light.  Chest:  Breasts:    Right: Mass present. No swelling or tenderness.     Comments: 4 marble-sized masses to right breast. 2 just above the areola, 1 to medial aspect of breast, and 1 just below areola.  Musculoskeletal:     Cervical back: Normal range of motion and neck supple.  Skin:    General: Skin is warm and dry.  Neurological:     Mental Status: She is alert and oriented to person, place, and time. Mental status is at baseline.     GCS: GCS eye subscore is 4. GCS verbal subscore is 5. GCS motor subscore is 6.  Psychiatric:        Behavior: Behavior is cooperative.      UC Treatments / Results  Labs (all labs ordered are listed, but only abnormal results are displayed) Labs Reviewed  CERVICOVAGINAL ANCILLARY ONLY    EKG   Radiology No results found.  Procedures Procedures (including critical care time)  Medications Ordered in UC Medications  ketorolac (TORADOL) 30 MG/ML injection 30 mg (30 mg Intramuscular Given 02/28/23 1944)  ondansetron (ZOFRAN-ODT) disintegrating tablet 4 mg (4 mg Oral Given 02/28/23 1944)  dexamethasone (DECADRON) injection 10 mg (10 mg Intramuscular Given 02/28/23 1944)    Initial Impression / Assessment and Plan / UC Course  I have reviewed the triage vital signs and the nursing notes.  Pertinent labs & imaging results  that were available during my care of the patient were reviewed by me and considered in my medical decision making (  see chart for details).     Patient presented with multiple small painless lumps to right breat x 1 year. Family history of breast cancer.   Upon assessment 4 marble-sized non-tender masses noted to right breast. 2 noted above areola, 1 to medial aspect of breast, and 1 just below areola. Ordered outpatient [place] for further evaluation of masses.  Patient also presented with vaginal discharge x 1 week. Denies unprotected sexual intercourse. GU exam deferred. Self swab performed for STI.   Additionally, patient presents with headache x1 month. Reports constant headache that varies in severity. Endorses mild nausea. Denies any other symptoms. No neurodeficitis on exam. GCS 15. EOMI and PERRLA.   Given IM Toradol, IM Decadron, and Zofran ODT for headache. Patient has appointment with neurology on 2/10 for chronic migraines.  Discussed follow-up, return, and ER precautions.  Final Clinical Impressions(s) / UC Diagnoses   Final diagnoses:  Mass of right breast, unspecified quadrant  Vaginal discharge  Bad headache     Discharge Instructions      I have ordered an outpatient ultrasound for your breast. They should call you to schedule an appointment, if they do not call within a week. Call [phone] to schedule an appointment.   Your swab results will come back in the next few days and someone will call if results are positive and require treatment.   Return here as needed.   You received medication today for your migraine. If you migraine persists, worsens, or you develop, blurred vision, numbness, weakness, slurred speech, or confusion please seek immediate medical treatment in the ER. Otherwise follow-up with neurology.     ED Prescriptions   None    PDMP not reviewed this encounter.   Wynonia Lawman A, NP 02/28/23 [phone]

## 2023-02-28 NOTE — Discharge Instructions (Addendum)
I have ordered an outpatient ultrasound for your breast. They should call you to schedule an appointment, if they do not call within a week. Call [phone] to schedule an appointment.   Your swab results will come back in the next few days and someone will call if results are positive and require treatment.   Return here as needed.   You received medication today for your migraine. If you migraine persists, worsens, or you develop, blurred vision, numbness, weakness, slurred speech, or confusion please seek immediate medical treatment in the ER. Otherwise follow-up with neurology.

## 2023-03-01 ENCOUNTER — Ambulatory Visit (HOSPITAL_COMMUNITY)
Admission: EM | Admit: 2023-03-01 | Discharge: 2023-03-01 | Disposition: A | Discharge status: Home / Self Care | Payer: Self-pay

## 2023-03-01 ENCOUNTER — Encounter (HOSPITAL_COMMUNITY): Payer: Self-pay | Admitting: Emergency Medicine

## 2023-03-01 DIAGNOSIS — R519 Headache, unspecified: Secondary | ICD-10-CM

## 2023-03-01 NOTE — Discharge Instructions (Addendum)
We gave you a migraine 'cocktail' yesterday and this did not improve your headache.  I am concerned that you need further advanced evaluation and suggest they had to the nearest emergency department for further evaluation.   Keep your neurology appointment and you can call and ask to be on their cancellation list. Return to clinic for new or urgent symptoms.

## 2023-03-01 NOTE — ED Provider Notes (Signed)
[facility]    CSN: [id number] Arrival date & time: 03/01/23  1505      History   Chief Complaint Chief Complaint  Patient presents with   Migraine    HPI Paula Hawkins is a 28 y.o. female.   Patient presents to clinic complaining of a severe headache.  She was seen in clinic yesterday and given a migraine cocktail, reports she did not have any relief with this and her headache is actually gotten worse and spread across her entire head.  She is having noise and light sensitivity.  No nausea or vomiting.  No vision loss or weakness. No weakness, slurred speech or confusion.   Has an appointment with neurology upcoming on the 10th of February. History of migraines. Headache has been ongoing for the past month.   The history is provided by the patient and medical records.  Migraine    Past Medical History:  Diagnosis Date   Abnormal uterine bleeding unrelated to menstrual cycle 08/25/2015   Asthma    Chlamydia    Migraine    migraines   Uterine polyp     Patient Active Problem List   Diagnosis Date Noted   Acute posttraumatic stress disorder 12/07/2021   Pelvic fracture (HCC) 12/04/2021   Abnormal uterine bleeding unrelated to menstrual cycle 08/25/2015    Past Surgical History:  Procedure Laterality Date   HYSTEROSCOPY WITH D & C N/A 11/23/2016   Procedure: DILATATION AND CURETTAGE /HYSTEROSCOPY;  Surgeon: Catalina Antigua, MD;  Location: [facility];  Service: Gynecology;  Laterality: N/A;   ORIF PELVIC FRACTURE WITH PERCUTANEOUS SCREWS Left 12/06/2021   Procedure: ORIF PELVIC FRACTURE WITH PERCUTANEOUS SCREWS;  Surgeon: Roby Lofts, MD;  Location: [facility];  Service: Orthopedics;  Laterality: Left;   TONSILLECTOMY      OB History     Gravida  0   Para  0   Term  0   Preterm  0   AB  0   Living  0      SAB  0   IAB  0   Ectopic  0   Multiple  0   Live Births               Home Medications    Prior to  Admission medications   Medication Sig Start Date End Date Taking? Authorizing Provider  topiramate (TOPAMAX) 50 MG tablet Take 1 tablet (50 mg total) by mouth at bedtime. Patient not taking: Reported on 01/05/2020 03/26/18 01/05/20  Elvina Sidle, MD    Family History Family History  Problem Relation Age of Onset   Diabetes Other    Asthma Other    Cancer Other    Asthma Father    Diabetes Mother    Asthma Mother    Hypertension Neg Hx     Social History Social History   Tobacco Use   Smoking status: Some Days    Types: Cigars   Smokeless tobacco: Never  Vaping Use   Vaping status: Never Used  Substance Use Topics   Alcohol use: Yes    Comment: occasionally   Drug use: No     Allergies   Latex, Kiwi extract, and Pineapple   Review of Systems Review of Systems  Per HPI   Physical Exam Triage Vital Signs ED Triage Vitals  Encounter Vitals Group     BP 03/01/23 1649 126/78     Systolic BP Percentile --  Diastolic BP Percentile --      Pulse Rate 03/01/23 1649 90     Resp 03/01/23 1649 18     Temp 03/01/23 1649 98.3 F (36.8 C)     Temp Source 03/01/23 1649 Oral     SpO2 03/01/23 1649 96 %     Weight --      Height --      Head Circumference --      Peak Flow --      Pain Score 03/01/23 1650 8     Pain Loc --      Pain Education --      Exclude from Growth Chart --    No data found.  Updated Vital Signs BP 126/78 (BP Location: Left Arm)   Pulse 90   Temp 98.3 F (36.8 C) (Oral)   Resp 18   LMP 02/17/2023 (Approximate)   SpO2 96%   Visual Acuity Right Eye Distance:   Left Eye Distance:   Bilateral Distance:    Right Eye Near:   Left Eye Near:    Bilateral Near:     Physical Exam Vitals and nursing note reviewed.  Constitutional:      Appearance: Normal appearance.  HENT:     Head: Normocephalic and atraumatic.     Right Ear: External ear normal.     Left Ear: External ear normal.     Nose: Nose normal.     Mouth/Throat:      Mouth: Mucous membranes are moist.  Eyes:     Conjunctiva/sclera: Conjunctivae normal.  Cardiovascular:     Rate and Rhythm: Normal rate.  Pulmonary:     Effort: Pulmonary effort is normal. No respiratory distress.  Musculoskeletal:        General: Normal range of motion.  Skin:    General: Skin is warm and dry.  Neurological:     General: No focal deficit present.     Mental Status: She is alert and oriented to person, place, and time.  Psychiatric:        Mood and Affect: Mood normal.        Behavior: Behavior normal.      UC Treatments / Results  Labs (all labs ordered are listed, but only abnormal results are displayed) Labs Reviewed - No data to display  EKG   Radiology No results found.  Procedures Procedures (including critical care time)  Medications Ordered in UC Medications - No data to display  Initial Impression / Assessment and Plan / UC Course  I have reviewed the triage vital signs and the nursing notes.  Pertinent labs & imaging results that were available during my care of the patient were reviewed by me and considered in my medical decision making (see chart for details).  Vitals and triage reviewed, patient is hemodynamically stable.  Worsening headache since migraine cocktail with no improvement, advised further evaluation at the emergency department where they have advanced imaging and access to other medications that may potentially help her headache.  Patient reports this has been ongoing issue and she will just wait for her neurology appointment.  Declined to go to the emergency department despite encouragement.  Work note provided.  Further evaluation and red flag symptoms discussed, again stressed importance of following up at the nearest emergency department.  Patient discharged in stable condition.     Final Clinical Impressions(s) / UC Diagnoses   Final diagnoses:  Bad headache     Discharge Instructions  We gave you a  migraine 'cocktail' yesterday and this did not improve your headache.  I am concerned that you need further advanced evaluation and suggest they had to the nearest emergency department for further evaluation.   Keep your neurology appointment and you can call and ask to be on their cancellation list. Return to clinic for new or urgent symptoms.     ED Prescriptions   None    PDMP not reviewed this encounter.   Kassie Keng, [place], [place] 03/01/23 [phone]

## 2023-03-01 NOTE — ED Triage Notes (Signed)
Patient c/o migraine HA x 1 month.  Patient is having some nausea, denies vomiting.  Patient denies any OTC pain meds.  Was seen yesterday and given injections w/o relief.

## 2023-03-02 ENCOUNTER — Telehealth (HOSPITAL_COMMUNITY): Payer: Self-pay

## 2023-03-02 LAB — CERVICOVAGINAL ANCILLARY ONLY
Bacterial Vaginitis (gardnerella): POSITIVE — AB
Candida Glabrata: NEGATIVE
Candida Vaginitis: NEGATIVE
Comment: NEGATIVE
Comment: NEGATIVE
Comment: NEGATIVE

## 2023-03-02 MED ORDER — METRONIDAZOLE 500 MG PO TABS: 500.0000 mg | ORAL_TABLET | Freq: Two times a day (BID) | ORAL | 0 refills | Status: AC

## 2023-03-02 NOTE — Telephone Encounter (Signed)
Per protocol, pt requires tx with metronidazole. Rx sent to pharmacy on file.

## 2023-03-13 ENCOUNTER — Ambulatory Visit: Payer: Self-pay | Admitting: Neurology

## 2023-03-13 ENCOUNTER — Encounter: Payer: Self-pay | Admitting: Neurology

## 2023-03-21 ENCOUNTER — Telehealth: Payer: Self-pay | Admitting: Physician Assistant

## 2023-03-21 DIAGNOSIS — B379 Candidiasis, unspecified: Secondary | ICD-10-CM

## 2023-03-21 DIAGNOSIS — T3695XA Adverse effect of unspecified systemic antibiotic, initial encounter: Secondary | ICD-10-CM

## 2023-03-21 MED ORDER — FLUCONAZOLE 150 MG PO TABS: 150.0000 mg | ORAL_TABLET | ORAL | 0 refills | Status: AC | PRN

## 2023-03-21 NOTE — Progress Notes (Signed)
Virtual Visit Consent   Paula Hawkins, you are scheduled for a virtual visit with a [facility] provider today. Just as with appointments in the office, your consent must be obtained to participate. Your consent will be active for this visit and any virtual visit you may have with one of our providers in the next 365 days. If you have a MyChart account, a copy of this consent can be sent to you electronically.  As this is a virtual visit, video technology does not allow for your provider to perform a traditional examination. This may limit your provider's ability to fully assess your condition. If your provider identifies any concerns that need to be evaluated in person or the need to arrange testing (such as labs, EKG, etc.), we will make arrangements to do so. Although advances in technology are sophisticated, we cannot ensure that it will always work on either your end or our end. If the connection with a video visit is poor, the visit may have to be switched to a telephone visit. With either a video or telephone visit, we are not always able to ensure that we have a secure connection.  By engaging in this virtual visit, you consent to the provision of healthcare and authorize for your insurance to be billed (if applicable) for the services provided during this visit. Depending on your insurance coverage, you may receive a charge related to this service.  I need to obtain your verbal consent now. Are you willing to proceed with your visit today? Paula Hawkins has provided verbal consent on 03/21/2023 for a virtual visit (video or telephone). Margaretann Loveless, PA-C  Date: 03/21/2023 6:44 PM   Virtual Visit via Video Note   IMargaretann Loveless, connected with  Paula Hawkins  ([id number], 06-Dec-1995) on 03/21/23 at  6:45 PM EST by a video-enabled telemedicine application and verified that I am speaking with the correct person using two identifiers.  Location: Patient: Virtual Visit  Location Patient: Home Provider: Virtual Visit Location Provider: Home Office   I discussed the limitations of evaluation and management by telemedicine and the availability of in person appointments. The patient expressed understanding and agreed to proceed.    History of Present Illness: Paula Hawkins is a 28 y.o. who identifies as a female who was assigned female at birth, and is being seen today for vaginal itching.  HPI: Vaginal Discharge The patient's primary symptoms include genital itching (burning) and vaginal discharge. Primary symptoms comment: mild skin rash. This is a new problem. The problem occurs constantly. The problem has been gradually worsening. The patient is experiencing no pain. Pertinent negatives include no chills, dysuria, fever, frequency, headaches or nausea. The vaginal discharge was thick and white. The vaginal bleeding is typical of menses. She has not been passing clots. She has not been passing tissue. Nothing aggravates the symptoms. She has tried nothing for the symptoms. The treatment provided no relief.  Recently (03/02/23) had Metronidazole for BV ; Reports she finished about a week ago and symptoms started day of last pill. Unfortunately, she is also on her menses which makes symptoms worse. She has been trying to keep area clean and dry.   Problems:  Patient Active Problem List   Diagnosis Date Noted   Acute posttraumatic stress disorder 12/07/2021   Pelvic fracture (HCC) 12/04/2021   Abnormal uterine bleeding unrelated to menstrual cycle 08/25/2015    Allergies:  Allergies  Allergen Reactions   Latex Anaphylaxis and Swelling  Kiwi Extract Itching, Swelling and Other (See Comments)    Itching in Ears and Throat and mouth swells   Pineapple Other (See Comments)    Burning in Throat and Tongue   Medications:  Current Outpatient Medications:    fluconazole (DIFLUCAN) 150 MG tablet, Take 1 tablet (150 mg total) by mouth every 3 (three) days as  needed., Disp: 2 tablet, Rfl: 0   metroNIDAZOLE (FLAGYL) 500 MG tablet, Take 1 tablet (500 mg total) by mouth 2 (two) times daily., Disp: 14 tablet, Rfl: 0  Observations/Objective: Patient is well-developed, well-nourished in no acute distress.  Resting comfortably at home.  Head is normocephalic, atraumatic.  No labored breathing.  Speech is clear and coherent with logical content.  Patient is alert and oriented at baseline.    Assessment and Plan: 1. Antibiotic-induced yeast infection (Primary) - fluconazole (DIFLUCAN) 150 MG tablet; Take 1 tablet (150 mg total) by mouth every 3 (three) days as needed.  Dispense: 2 tablet; Refill: 0  - Symptoms consistent with yeast vaginosis secondary to recent antibiotic use - Fluconazole prescribed - Limit bubble baths, scented lotions/soaps/detergents - Limit tight fitting clothing - Seek on person evaluation if not improving or if symptoms worsen   Follow Up Instructions: I discussed the assessment and treatment plan with the patient. The patient was provided an opportunity to ask questions and all were answered. The patient agreed with the plan and demonstrated an understanding of the instructions.  A copy of instructions were sent to the patient via MyChart unless otherwise noted below.    The patient was advised to call back or seek an in-person evaluation if the symptoms worsen or if the condition fails to improve as anticipated.    Margaretann Loveless, PA-C

## 2023-03-21 NOTE — Patient Instructions (Signed)
Paula Hawkins, thank you for joining Margaretann Loveless, PA-C for today's virtual visit.  While this provider is not your primary care provider (PCP), if your PCP is located in our provider database this encounter information will be shared with them immediately following your visit.   A [facility] MyChart account gives you access to today's visit and all your visits, tests, and labs performed at [facility] " click here if you don't have a [facility] MyChart account or go to [email or web address]  Consent: (Patient) Paula Hawkins provided verbal consent for this virtual visit at the beginning of the encounter.  Current Medications:  Current Outpatient Medications:    fluconazole (DIFLUCAN) 150 MG tablet, Take 1 tablet (150 mg total) by mouth every 3 (three) days as needed., Disp: 2 tablet, Rfl: 0   metroNIDAZOLE (FLAGYL) 500 MG tablet, Take 1 tablet (500 mg total) by mouth 2 (two) times daily., Disp: 14 tablet, Rfl: 0   Medications ordered in this encounter:  Meds ordered this encounter  Medications   fluconazole (DIFLUCAN) 150 MG tablet    Sig: Take 1 tablet (150 mg total) by mouth every 3 (three) days as needed.    Dispense:  2 tablet    Refill:  0    Supervising Provider:   Merrilee Jansky [[id number]]     *If you need refills on other medications prior to your next appointment, please contact your pharmacy*  Follow-Up: Call back or seek an in-person evaluation if the symptoms worsen or if the condition fails to improve as anticipated.  [facility] [phone]  Other Instructions  Vaginal Yeast Infection, Adult  Vaginal yeast infection is a condition that causes vaginal discharge as well as soreness, swelling, and redness (inflammation) of the vagina. This is a common condition. Some women get this infection frequently. What are the causes? This condition is caused by a change in the normal balance of the yeast (Candida) and normal  bacteria that live in the vagina. This change causes an overgrowth of yeast, which causes the inflammation. What increases the risk? The condition is more likely to develop in women who: Take antibiotic medicines. Have diabetes. Take birth control pills. Are pregnant. Douche often. Have a weak body defense system (immune system). Have been taking steroid medicines for a long time. Frequently wear tight clothing. What are the signs or symptoms? Symptoms of this condition include: White, thick, creamy vaginal discharge. Swelling, itching, redness, and irritation of the vagina. The lips of the vagina (labia) may be affected as well. Pain or a burning feeling while urinating. Pain during sex. How is this diagnosed? This condition is diagnosed based on: Your medical history. A physical exam. A pelvic exam. Your health care provider will examine a sample of your vaginal discharge under a microscope. Your health care provider may send this sample for testing to confirm the diagnosis. How is this treated? This condition is treated with medicine. Medicines may be over-the-counter or prescription. You may be told to use one or more of the following: Medicine that is taken by mouth (orally). Medicine that is applied as a cream (topically). Medicine that is inserted directly into the vagina (suppository). Follow these instructions at home: Take or apply over-the-counter and prescription medicines only as told by your health care provider. Do not use tampons until your health care provider approves. Do not have sex until your infection has cleared. Sex can prolong or worsen your symptoms of infection. Ask your health  care provider when it is safe to resume sexual activity. Keep all follow-up visits. This is important. How is this prevented?  Do not wear tight clothes, such as pantyhose or tight pants. Wear breathable cotton underwear. Do not use douches, perfumed soap, creams, or  powders. Wipe from front to back after using the toilet. If you have diabetes, keep your blood sugar levels under control. Ask your health care provider for other ways to prevent yeast infections. Contact a health care provider if: You have a fever. Your symptoms go away and then return. Your symptoms do not get better with treatment. Your symptoms get worse. You have new symptoms. You develop blisters in or around your vagina. You have blood coming from your vagina and it is not your menstrual period. You develop pain in your abdomen. Summary Vaginal yeast infection is a condition that causes discharge as well as soreness, swelling, and redness (inflammation) of the vagina. This condition is treated with medicine. Medicines may be over-the-counter or prescription. Take or apply over-the-counter and prescription medicines only as told by your health care provider. Do not douche. Resume sexual activity or use of tampons as instructed by your health care provider. Contact a health care provider if your symptoms do not get better with treatment or your symptoms go away and then return. This information is not intended to replace advice given to you by your health care provider. Make sure you discuss any questions you have with your health care provider. Document Revised: 04/06/2020 Document Reviewed: 04/06/2020 Elsevier Patient Education  2024 [employer]   If you have been instructed to have an in-person evaluation today at a local Urgent Care facility, please use the link below. It will take you to a list of all of our available [facility], including address, phone number and hours of operation. Please do not delay care.  [facility]  If you or a family member do not have a primary care provider, use the link below to schedule a visit and establish care. When you choose a [facility] primary care physician or advanced practice provider, you gain a long-term  partner in health. Find a Primary Care Provider  Learn more about [facility]'s in-office and virtual care options: [facility] - Get Care Now

## 2023-06-24 ENCOUNTER — Other Ambulatory Visit: Payer: Self-pay | Admitting: Medical Genetics

## 2023-07-19 ENCOUNTER — Other Ambulatory Visit: Payer: Self-pay

## 2023-11-20 ENCOUNTER — Other Ambulatory Visit: Payer: Self-pay | Admitting: Genetic Counselor

## 2023-11-20 DIAGNOSIS — Z006 Encounter for examination for normal comparison and control in clinical research program: Secondary | ICD-10-CM
